# Patient Record
Sex: Male | Born: 1950 | Race: White | Hispanic: No | Marital: Married | Smoking: Current every day smoker
Health system: Southern US, Community
[De-identification: ages and names within clinical notes are randomized; demographics above are authoritative.]

## PROBLEM LIST (undated history)

## (undated) DIAGNOSIS — M2550 Pain in unspecified joint: Secondary | ICD-10-CM

## (undated) DIAGNOSIS — I731 Thromboangiitis obliterans [Buerger's disease]: Secondary | ICD-10-CM

## (undated) DIAGNOSIS — J449 Chronic obstructive pulmonary disease, unspecified: Secondary | ICD-10-CM

## (undated) DIAGNOSIS — K5090 Crohn's disease, unspecified, without complications: Secondary | ICD-10-CM

## (undated) DIAGNOSIS — F419 Anxiety disorder, unspecified: Secondary | ICD-10-CM

## (undated) DIAGNOSIS — I10 Essential (primary) hypertension: Secondary | ICD-10-CM

## (undated) DIAGNOSIS — D649 Anemia, unspecified: Secondary | ICD-10-CM

## (undated) DIAGNOSIS — M1990 Unspecified osteoarthritis, unspecified site: Secondary | ICD-10-CM

## (undated) DIAGNOSIS — I2699 Other pulmonary embolism without acute cor pulmonale: Principal | ICD-10-CM

## (undated) DIAGNOSIS — E785 Hyperlipidemia, unspecified: Secondary | ICD-10-CM

## (undated) DIAGNOSIS — I639 Cerebral infarction, unspecified: Secondary | ICD-10-CM

## (undated) HISTORY — PX: COLON SURGERY: SHX602

## (undated) HISTORY — DX: Anemia, unspecified: D64.9

## (undated) HISTORY — DX: Pain in unspecified joint: M25.50

## (undated) HISTORY — DX: Cerebral infarction, unspecified: I63.9

## (undated) HISTORY — DX: Essential (primary) hypertension: I10

## (undated) HISTORY — DX: Chronic obstructive pulmonary disease, unspecified: J44.9

## (undated) HISTORY — DX: Hyperlipidemia, unspecified: E78.5

## (undated) HISTORY — PX: OTHER SURGICAL HISTORY: SHX169

## (undated) HISTORY — DX: Unspecified osteoarthritis, unspecified site: M19.90

## (undated) HISTORY — DX: Thromboangiitis obliterans (Buerger's disease): I73.1

## (undated) HISTORY — DX: Anxiety disorder, unspecified: F41.9

## (undated) HISTORY — DX: Crohn's disease, unspecified, without complications: K50.90

## (undated) HISTORY — DX: Other pulmonary embolism without acute cor pulmonale: I26.99

## (undated) HISTORY — PX: SMALL INTESTINE SURGERY: SHX150

---

## 1977-06-19 HISTORY — PX: APPENDECTOMY: SHX54

## 2009-03-29 ENCOUNTER — Encounter: Payer: Self-pay | Admitting: Gastroenterology

## 2009-04-16 ENCOUNTER — Encounter: Payer: Self-pay | Admitting: Gastroenterology

## 2009-04-23 ENCOUNTER — Encounter (INDEPENDENT_AMBULATORY_CARE_PROVIDER_SITE_OTHER): Payer: Self-pay | Admitting: *Deleted

## 2009-05-06 ENCOUNTER — Encounter (INDEPENDENT_AMBULATORY_CARE_PROVIDER_SITE_OTHER): Payer: Self-pay | Admitting: *Deleted

## 2009-05-10 ENCOUNTER — Ambulatory Visit: Payer: Self-pay | Admitting: Gastroenterology

## 2009-05-26 ENCOUNTER — Ambulatory Visit: Payer: Self-pay | Admitting: Gastroenterology

## 2009-05-27 ENCOUNTER — Encounter: Payer: Self-pay | Admitting: Gastroenterology

## 2009-05-31 ENCOUNTER — Encounter: Payer: Self-pay | Admitting: Gastroenterology

## 2009-06-25 ENCOUNTER — Ambulatory Visit: Payer: Self-pay | Admitting: Gastroenterology

## 2009-06-25 DIAGNOSIS — K5090 Crohn's disease, unspecified, without complications: Secondary | ICD-10-CM | POA: Insufficient documentation

## 2009-06-28 ENCOUNTER — Telehealth: Payer: Self-pay | Admitting: Gastroenterology

## 2009-06-29 ENCOUNTER — Encounter: Payer: Self-pay | Admitting: Gastroenterology

## 2009-06-29 ENCOUNTER — Ambulatory Visit (HOSPITAL_COMMUNITY): Admission: RE | Admit: 2009-06-29 | Discharge: 2009-06-29 | Payer: Self-pay | Admitting: Gastroenterology

## 2009-07-02 ENCOUNTER — Telehealth: Payer: Self-pay | Admitting: Gastroenterology

## 2009-07-23 ENCOUNTER — Ambulatory Visit: Payer: Self-pay | Admitting: Gastroenterology

## 2009-08-09 ENCOUNTER — Telehealth (INDEPENDENT_AMBULATORY_CARE_PROVIDER_SITE_OTHER): Payer: Self-pay | Admitting: *Deleted

## 2009-08-10 ENCOUNTER — Encounter (INDEPENDENT_AMBULATORY_CARE_PROVIDER_SITE_OTHER): Payer: Self-pay | Admitting: *Deleted

## 2009-08-12 ENCOUNTER — Telehealth (INDEPENDENT_AMBULATORY_CARE_PROVIDER_SITE_OTHER): Payer: Self-pay | Admitting: *Deleted

## 2009-08-13 ENCOUNTER — Ambulatory Visit: Payer: Self-pay | Admitting: Gastroenterology

## 2009-08-16 ENCOUNTER — Telehealth: Payer: Self-pay | Admitting: Gastroenterology

## 2009-08-25 ENCOUNTER — Encounter (INDEPENDENT_AMBULATORY_CARE_PROVIDER_SITE_OTHER): Payer: Self-pay | Admitting: *Deleted

## 2009-08-31 LAB — CONVERTED CEMR LAB
ALT: 16 units/L (ref 0–53)
AST: 24 units/L (ref 0–37)
Albumin: 3.4 g/dL — ABNORMAL LOW (ref 3.5–5.2)
BUN: 10 mg/dL (ref 6–23)
Creatinine, Ser: 1 mg/dL (ref 0.4–1.5)
HCT: 38.1 % — ABNORMAL LOW (ref 39.0–52.0)
Hemoglobin: 13.1 g/dL (ref 13.0–17.0)
Lymphocytes Relative: 16.6 % (ref 12.0–46.0)
MCV: 104 fL — ABNORMAL HIGH (ref 78.0–100.0)
Neutrophils Relative %: 75.1 % (ref 43.0–77.0)
Platelets: 290 10*3/uL (ref 150.0–400.0)
Potassium: 4 meq/L (ref 3.5–5.1)
RDW: 13.1 % (ref 11.5–14.6)
Total Bilirubin: 1 mg/dL (ref 0.3–1.2)
Total Protein: 6.6 g/dL (ref 6.0–8.3)

## 2009-09-17 ENCOUNTER — Ambulatory Visit: Payer: Self-pay | Admitting: Gastroenterology

## 2010-07-17 LAB — CONVERTED CEMR LAB
ALT: 28 units/L (ref 0–53)
AST: 31 units/L (ref 0–37)
Albumin: 3.3 g/dL — ABNORMAL LOW (ref 3.5–5.2)
CO2: 32 meq/L (ref 19–32)
Calcium: 9.4 mg/dL (ref 8.4–10.5)
Chloride: 101 meq/L (ref 96–112)
GFR calc non Af Amer: 92.69 mL/min (ref >60)
HCT: 40.3 % (ref 39.0–52.0)
Lymphocytes Relative: 21.3 % (ref 12.0–46.0)
Lymphs Abs: 1.8 10*3/uL (ref 0.7–4.0)
MCHC: 34.5 g/dL (ref 30.0–36.0)
Platelets: 263 10*3/uL (ref 150.0–400.0)
Potassium: 3.4 meq/L — ABNORMAL LOW (ref 3.5–5.1)
RBC: 3.8 M/uL — ABNORMAL LOW (ref 4.22–5.81)
RDW: 12.7 % (ref 11.5–14.6)
Sed Rate: 9 mm/hr (ref 0–22)
Total Protein: 6.4 g/dL (ref 6.0–8.3)
WBC: 8.3 10*3/uL (ref 4.5–10.5)

## 2010-07-19 NOTE — Progress Notes (Signed)
Summary: labs due  Phone Note Outgoing Call Call back at [facility] Phone [phone]   Call placed by: Chales Abrahams CMA [name]),  August 09, 2009 1:35 PM Summary of Call: called the number listed and it is incorrect.  Called the number in the triage phone note and left a message. Initial call taken by: Chales Abrahams CMA [name]),  August 09, 2009 1:37 PM  Follow-up for Phone Call        left message on machine to call back   letter mailed Chales Abrahams CMA [name])  August 10, 2009 9:36 AM

## 2010-07-19 NOTE — Progress Notes (Signed)
Summary: Medication Refill  Phone Note Call from Patient Call back at [phone]   Caller: Patient Call For: Dr. Christella Hartigan Reason for Call: Refill Medication, Talk to Nurse Summary of Call: Pt is needing his Entocort refilled but said Ins. is needing prior approval Initial call taken by: Karna Christmas,  June 28, 2009 12:27 PM  Follow-up for Phone Call        i explained to the pt that a prior auth has been initialized but it sometimes takes 48 hours to get a response.   Follow-up by: Chales Abrahams CMA [name]),  June 28, 2009 12:47 PM

## 2010-07-19 NOTE — Progress Notes (Signed)
Summary: lab results request  Phone Note Call from Patient Call back at [phone]   Caller: Patient Call For: Dr. Christella Hartigan Reason for Call: Lab or Test Results Summary of Call: would like lab results Initial call taken by: Vallarie Mare,  August 16, 2009 11:56 AM  Follow-up for Phone Call        cbc and cmet are unremarkable Follow-up by: Louis Meckel MD,  August 16, 2009 4:56 PM     Appended Document: lab results request  Pt. given lab results.

## 2010-07-19 NOTE — Assessment & Plan Note (Signed)
History of Present Illness Visit Type: new patient Primary GI MD: Rob Bunting MD Primary Provider: Robert Bellow, MD Chief Complaint: IBD treatment History of Present Illness:     who had 1979 crohn's surgery (ileocecectomy?).  Was on meds for years (occasionally prednisone).  Tells me stayed anemic for years.  was put on shots (iron he thinks).  The last time he was on crohn's meds was about 15 years.    currently has formed, sometime sloose stools.  very rarely a bit of red blood.  No abd pains. If he eats alot at one sitting, he has abd pain, nauseas, distension, no vomitting.  Because of this he doesn't eat big meals.    Overall his weight has been stable for at least 10 years.  his biggest complaint today is that of arthritis pains in his pains. He tells me that even low dose of steroids will dramatically help his hands for as long as he takes the prednisone. This will work even on low doses of prednisone.  I did a colonoscopy for him last month. This was set up as a routine screening examination. Prior to the procedure he began to tell me about his history of Crohn's disease. I found to small, hyperplastic polyps. His ileocolonic anastomosis was strictured, biopsies taken showed fibrosis without active inflammation.           Current Medications (verified): 1)  Lisinopril-Hydrochlorothiazide 10-12.5 Mg Tabs (Lisinopril-Hydrochlorothiazide) .... Take 1 Tablet By Mouth Once A Day 2)  Metoprolol Succinate 100 Mg Xr24h-Tab (Metoprolol Succinate) .... Take 1 Tablet By Mouth Once A Day 3)  Amitriptyline Hcl 50 Mg Tabs (Amitriptyline Hcl) .... Take 1 Tab By Mouth At Bedtime 4)  Tramadol Hcl 50 Mg Tabs (Tramadol Hcl) .... Take 1 Tablet By Mouth Three Times A Day As Needed 5)  Aspirin 81 Mg Tbec (Aspirin) .... Take 1 Tablet By Mouth Once A Day  Allergies (verified): No Known Drug Allergies  Past History:  Past Medical History: Hyperlipidemia Crohns Disease 1979  surgery anemia COPD Hypertension Kidney stones Arthritis Anxiety  Past Surgical History: Crohn's surgery 1979  Family History: no colon cancer  Social History: married, one daughter, works [employer] representative, smokes one half packs per day currently, drinks 2 alcoholic beverages per day, drinks 3 caffeinated beverages per day.  Review of Systems       Pertinent positive and negative review of systems were noted in the above HPI and GI specific review of systems.  All other review of systems was otherwise negative.   Vital Signs:  Patient profile:   60 year old male Height:      69 inches Weight:      152 pounds BMI:     22.53 Pulse rate:   68 / minute Pulse rhythm:   irregular BP sitting:   90 / 60  (left arm) Cuff size:   regular  Vitals Entered By: Francee Piccolo CMA [name]) (June 25, 2009 2:49 PM)  Physical Exam  Additional Exam:  Constitutional: generally well appearing Psychiatric: alert and oriented times 3 Eyes: extraocular movements intact Mouth: oropharynx moist, no lesions Neck: supple, no lymphadenopathy Cardiovascular: heart regular rate and rythm Lungs: CTA bilaterally Abdomen: soft, non-tender, non-distended, no obvious ascites, no peritoneal signs, normal bowel sounds Extremities: no lower extremity edema bilaterally Skin: no lesions on visible extremities     Impression & Recommendations:  Problem # 1:  Crohn's disease he add least has a strictured ileocolonic anastomosis, fibrosis. It is not  clear whether he also has active Crohn's disease. I think he is having symptoms from his stricturing, postprandial mild obstructive symptoms which she has been dealing with for many years. His hands, joint pains may be IBD related especially given the fact that prednisone will help them.  He will get a CBC, complete metabolic profile, sedimentation rate and phenotyping for TPMT enzyme. She will also get a small bowel follow-through. He will return to see me  in 4 weeks' time. I will also start him on budesonide 3 pills a day.  Other Orders: TLB-CBC Platelet - w/Differential (85025-CBCD) TLB-CMP (Comprehensive Metabolic Pnl) (80053-COMP) TLB-Sedimentation Rate (ESR) (85652-ESR) T-[facility] TPMT Enzyme #[id number] ([address])  Patient Instructions: 1)  You will get lab test(s) done today (cbc, cmet, esr, TPMT phenotype testing). 2)  Smoking will make Crohn's worse, try stopping. 3)  Will set up UGI with SBFT, check for small bowel crohn's disease. 4)  Return to see Dr. Christella Hartigan in 4 weeks. 5)  A copy of this information will be sent to Dr. Perrin Maltese. 6)  Start budesonide 3 pills a day, called into your pharmacy. 7)  The medication list was reviewed and reconciled.  All changed / newly prescribed medications were explained.  A complete medication list was provided to the patient / caregiver. Prescriptions: ENTOCORT EC 3 MG  CP24 (BUDESONIDE) Take 3 capsules daily  #90 x 3   Entered and Authorized by:   Rachael Fee MD   Signed by:   Rachael Fee MD on 06/25/2009   Method used:   [facility]  [address] [id number]       [address]       [place], [place]  [address]       Ph: [phone] or [phone]       Fax: [phone]   RxID:   [id number]   Appended Document: Orders Update/UGI    Clinical Lists Changes  Orders: Added new Test order of UGI with SBFT (UGI w/SBFT) - Signed      Appended Document:  [facility] testing: normal TPMT enzyme activity dated 06/2009

## 2010-07-19 NOTE — Progress Notes (Signed)
Summary: Triage  Phone Note Call from Patient Call back at [phone]   Caller: Patient Call For: Dr. Christella Hartigan Reason for Call: Talk to Nurse Summary of Call: Pt. was prescribed Entocort and it is too expensive. Wanted to know if we have any type of discount cards for this medication or if something else can be prescribed Initial call taken by: Karna Christmas,  July 02, 2009 12:58 PM  Follow-up for Phone Call        can you check on any discount programs from drug company Follow-up by: Rachael Fee MD,  July 02, 2009 1:32 PM  Additional Follow-up for Phone Call Additional follow up Details #1::        Drug company is mailing the pt a voucher to use for his medication.  pt aware Additional Follow-up by: Chales Abrahams CMA [name]),  July 02, 2009 2:02 PM

## 2010-07-19 NOTE — Letter (Signed)
Summary: Appointment Reminder  [facility]  [address] [place], [place] [address]   Phone: [phone]  Fax: [phone]        August 10, 2009 MRN: [id number]    Tyler [name] [address] [place], [place]  [address]    Dear [name],   We have been unable to reach you by phone to remind you of a lab  appointment that was recommended for you by Dr. Christella Hartigan.  It is very   important that you come to our basement lab at your soonest convenience.   We hope you allow [place] to participate in your health care needs. The   basement lab is open from 7:30am to 5:00pm Monday thru Friday at our   [place] office.     Sincerely,    Chales Abrahams CMA (AAMA)

## 2010-07-19 NOTE — Miscellaneous (Signed)
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

## 2010-07-19 NOTE — Progress Notes (Signed)
Summary: Triage  Phone Note Call from Patient Call back at Home Phone [phone]   Summary of Call: Pt feels the azathioprine is making him hurt all over, should he continue to take?  He is coming in Monday to get labs.  [phone]  cell Initial call taken by: Chales Abrahams CMA [name]),  August 12, 2009 2:57 PM  Follow-up for Phone Call        have him cut down to 2 pills a day (100mg ) and see if symptoms improve. Follow-up by: Rachael Fee MD,  August 12, 2009 3:32 PM  Additional Follow-up for Phone Call Additional follow up Details #1::        pt aware, he will call back if symptoms do not improve Additional Follow-up by: Chales Abrahams CMA [name]),  August 12, 2009 3:39 PM

## 2010-07-19 NOTE — Letter (Signed)
Summary: Results Letter  [facility]  [address] [place], [place] [address]   Phone: [phone]  Fax: [phone]        August 25, 2009 MRN: [id number]    Tyler [name] [address] [place], [place]  [address]    Dear [name],  I have been unable to reach you by phone regarding your lab results.  Please call at your earliest convenience so that we may discuss your   results.  Thank you for your time.          Sincerely,  Chales Abrahams CMA (AAMA)  This letter has been electronically signed by your physician.  Appended Document: Results Letter letter mailed

## 2010-07-19 NOTE — Assessment & Plan Note (Signed)
Review of gastrointestinal problems: 1. Crohn's ileitis; diagnosed 1979, remote ileocecectomy. Colonoscopy December 2010 (sent in for routine screening) found strictured ileocecal anastomosis, biopsies showed fibrosis without active inflammation.  He has intermittent postprandial obstructive type pains that are not very bothersome to him, December 2000 and upper GI with small bowel follow-through showed stricture at neo TI. Labs December 2010: sedimentation rate normal, TPMT phenotyping was normal.    History of Present Illness Visit Type: Follow-up Visit Primary GI MD: Rob Bunting MD Primary Provider: Robert Bellow, MD Chief Complaint: Follow up History of Present Illness:     very pleasant 60 year old man whom I last saw about and then he has started on Entocort 3 pills a day. He may have noticed some slight improvement in his arthritis symptoms, not much difference in his abdominal pains.           Current Medications (verified): 1)  Lisinopril-Hydrochlorothiazide 10-12.5 Mg Tabs (Lisinopril-Hydrochlorothiazide) .... Take 1 Tablet By Mouth Once A Day 2)  Metoprolol Succinate 100 Mg Xr24h-Tab (Metoprolol Succinate) .... Take 1 Tablet By Mouth Once A Day 3)  Amitriptyline Hcl 50 Mg Tabs (Amitriptyline Hcl) .... Take 1 Tab By Mouth At Bedtime 4)  Tramadol Hcl 50 Mg Tabs (Tramadol Hcl) .... Take 1 Tablet By Mouth Three Times A Day As Needed 5)  Aspirin 81 Mg Tbec (Aspirin) .... Take 1 Tablet By Mouth Once A Day 6)  Entocort Ec 3 Mg  Cp24 (Budesonide) .... Take 3 Capsules Daily  Allergies (verified): No Known Drug Allergies  Vital Signs:  Patient profile:   60 year old male Height:      69 inches Weight:      153.0 pounds BMI:     22.68 Pulse rate:   68 / minute Pulse rhythm:   regular BP sitting:   110 / 64  (right arm) Cuff size:   regular  Vitals Entered By: Harlow Mares CMA [name]) (July 23, 2009 3:48 PM)  Physical Exam  Additional Exam:  Constitutional:  generally well appearing Psychiatric: alert and oriented times 3 Abdomen: soft, non-tender, non-distended, normal bowel sounds    Impression & Recommendations:  Problem # 1:  Crohn's disease he poses a therapeutic dilemma. It is not appear that he has active Crohn's disease in his GI tract, certainly no Crohn's colitis on colonoscopy and the stricture at his neoterminal ileum appeared fibrotic, old endoscopically and on biopsies. He has intermittent, mild postprandial discomforts that may be obstructive in nature. He has dealt with this for many many years and is frankly not very bothered by it. He is most bothered however by joint pains in his hands especially that are usually relieved with steroids. I wonder if these are IBD related joint pains.   He has not noticed too much improvement on Entocort (that is not very good improvement with his joint pains). I will like to stop his Entocort and start him on immunomodulators. I think clinicallythe goal will be to see if his possibly IBD related joint pains are improved with a dose of azathioprine 150 mg daily over the next few months. I have also called in some tramadol as well. He will return to see me in 6 weeks and we will get repeat set of labs including a CBC, complete metabolic profile 2 weeks for now.  Patient Instructions: 1)  Decrease the entocort by 1 pill every week. Should be off in 2-3 weeks. 2)  Start azathiaprine 150mg  a day (3 pills a day, called  in to pharmacy). 3)  You will get lab test(s) done in 2 weeks (cbc, cmet). 4)  Tramadal called in to pharmacy. 5)  Return to see Dr. Christella Hartigan in 2 months. 6)  The medication list was reviewed and reconciled.  All changed / newly prescribed medications were explained.  A complete medication list was provided to the patient / caregiver. Prescriptions: AZATHIOPRINE 50 MG  TABS (AZATHIOPRINE) take 3 pills every morning  #90 x 3   Entered and Authorized by:   Rachael Fee MD   Signed by:    Rachael Fee MD on 07/23/2009   Method used:   [facility]  [address] [id number]       [address]       [place], [place]  [address]       Ph: [phone] or [phone]       Fax: [phone]   RxID:   [id number] TRAMADOL HCL 50 MG TABS (TRAMADOL HCL) Take 1 tablet by mouth three times a day as needed  #90 x 3   Entered and Authorized by:   Rachael Fee MD   Signed by:   Rachael Fee MD on 07/23/2009   Method used:   [facility]        [facility]  [address] [id number]       [address]       [place], [place]  [address]       Ph: [phone] or [phone]       Fax: [phone]   RxID:   [id number]

## 2010-09-03 ENCOUNTER — Encounter: Payer: Self-pay | Admitting: Cardiology

## 2010-09-15 ENCOUNTER — Ambulatory Visit: Payer: Self-pay | Admitting: Cardiology

## 2010-12-12 ENCOUNTER — Encounter (INDEPENDENT_AMBULATORY_CARE_PROVIDER_SITE_OTHER): Payer: 59 | Admitting: Surgery

## 2010-12-12 ENCOUNTER — Encounter (INDEPENDENT_AMBULATORY_CARE_PROVIDER_SITE_OTHER): Payer: 59

## 2010-12-12 DIAGNOSIS — I7300 Raynaud's syndrome without gangrene: Secondary | ICD-10-CM

## 2010-12-12 DIAGNOSIS — M79609 Pain in unspecified limb: Secondary | ICD-10-CM

## 2010-12-12 DIAGNOSIS — I731 Thromboangiitis obliterans [Buerger's disease]: Secondary | ICD-10-CM

## 2010-12-13 NOTE — Assessment & Plan Note (Signed)
OFFICE VISIT  Tyler [name] DOB:  28-Mar-1951                                       12/12/2010 JXBJY#:[id number]  REASON FOR VISIT:  Evaluate both hands, rule out vascular disease.  REFERRING PHYSICIANS:  Dr. Merla Riches, Dr. Perrin Maltese.  HISTORY:  This is [name] 60 year old gentleman that I am seeing for evaluation of bilateral hand pain.  The patient states that he has been having problems for greater than [name] year.  He complains of pain in his joints and his hands.  They appear to lock up and become very painful. His fingers will turn white and his hand cramps.  These are exacerbated by nothing.  They are relieved with the use of prednisone and hydrocodone.  He has not had any ulceration or nonhealing wounds on his hands.  The patient does smoke 2 packs per day and has done so for over 30 years.  He has undergone bilateral carpal tunnel injections without improvement.  He has had an EMG nerve conduction study.  He has [name] history of Crohn's which has not been active for several decades following resection.  He does not complain of any redness or bluish change in color.  He has not had any swelling.  REVIEW OF SYSTEMS:  GENERAL:  Negative for fevers, chills, weight gain, weight loss. CARDIAC:  Positive for shortness of breath with exertion. GI:  Positive for blood in the stool. MUSCULOSKELETAL:  Positive for joint pain. All other review of systems are negative as documented in the encounter form except what is stated in the above HPI.  PAST MEDICAL HISTORY:  Crohn's disease, hypertension, COPD.  PAST SURGICAL HISTORY:  Bowel resection 1979.  SOCIAL HISTORY:  Works in [employer] representative.  Smokes 2 packs cigarettes per day for 30 years.  Occasional alcohol.  FAMILY HISTORY:  Negative for autoimmune disease.  No premature cardiovascular disease.  ALLERGIES:  None.  PHYSICAL EXAM:  Vital signs:  Heart rate is 81, blood pressure 122/81, O2 sats 98%.  General:  He  is well-appearing, in no distress.  HEENT: Within normal limits.  Respirations:  Nonlabored.  Cardiovascular:  He has palpable radial and brachial pulses bilaterally.  Abdomen:  Soft, nontender.  Musculoskeletal:  Without major deformity.  Skin:  No rash. Neurological:  No focal deficits.  DIAGNOSTIC STUDIES:  Ultrasound of his upper extremities was performed today.  This reveals triphasic waveforms and bilateral axillary, subclavian, brachial and radial arteries.  The right ulnar is biphasic and the left ulnar is triphasic.  He has dampened first digit waveforms on the right but all the other digital waveforms are normal.  ASSESSMENT:  Bilateral hand pain.  PLAN:  With the patient's clinical exam findings as well as his ultrasound I do not feel that this is related to arterial insufficiency. I do not at this point see the need for additional invasive imaging such as angiogram because he does have such brisk pulses in his wrists.  I think this most likely would make Buerger's disease less likely but still possible given his smoking history.  His history is also not consistent with Raynaud's phenomenon as he does not endorse [name] bluish or reddish skin color changes but only is turning white.  He most likely has an autoimmune component given his history of Crohn's disease. Regardless, at this point I do not think that this is related  to arterial insufficiency.  The patient will follow up on [name] p.r.n. basis.    Jorge Ny, MD Electronically Signed  VWB/MEDQ  D:  12/12/2010  T:  12/13/2010  Job:  [id number]  cc:   Vilma Prader Dr Ula Lingo P. Merla Riches, M.D. Dr Vilma Prader

## 2011-05-27 ENCOUNTER — Ambulatory Visit (INDEPENDENT_AMBULATORY_CARE_PROVIDER_SITE_OTHER): Payer: 59

## 2011-05-27 DIAGNOSIS — I7300 Raynaud's syndrome without gangrene: Secondary | ICD-10-CM

## 2011-05-27 DIAGNOSIS — M25549 Pain in joints of unspecified hand: Secondary | ICD-10-CM

## 2011-05-30 ENCOUNTER — Encounter: Payer: Self-pay | Admitting: Surgery

## 2011-06-02 ENCOUNTER — Encounter: Payer: Self-pay | Admitting: Surgery

## 2011-06-05 ENCOUNTER — Encounter: Payer: Self-pay | Admitting: Surgery

## 2011-06-05 ENCOUNTER — Ambulatory Visit (INDEPENDENT_AMBULATORY_CARE_PROVIDER_SITE_OTHER): Payer: Managed Care, Other (non HMO) | Admitting: Surgery

## 2011-06-05 VITALS — BP 134/91 | HR 76 | Temp 98.1°F | Ht 69.0 in | Wt 162.0 lb

## 2011-06-05 DIAGNOSIS — M79643 Pain in unspecified hand: Secondary | ICD-10-CM | POA: Insufficient documentation

## 2011-06-05 DIAGNOSIS — M79609 Pain in unspecified limb: Secondary | ICD-10-CM

## 2011-06-05 NOTE — Progress Notes (Signed)
[facility] [place]   Patient name: Tyler [name] MRN: [id number] DOB: [birth year] Sex: male     Chief Complaint  Patient presents with  . New Evaluation    Right Hand pain ( Has seen Rheumatology in [facility])   Right fingers stay blanched and tingle. Tips turn purple especially in the cold.  No injury.    HISTORY OF PRESENT ILLNESS: The patient returns today having been sent from the emergency department. I last saw him in June of this year for evaluation of bilateral hand pain. He's been having this issue for greater than one year. He states that his hands and fingers will become white-tan painful but that'll usually resolves within several days. About a week he'll he had another episode and falling his right hand and has not improved. He states that his right hand his cold is somewhat bluish on appearance on his index finger there is a skin tear that has not healed in over a week but he states is from a splinter. He was initially evaluated with ultrasound and found to have triphasic radial artery signals bilaterally the right ulnar was biphasic he did have dampened waveforms in the first digit on the right but all of the waveforms were normal I felt this was likely some form of an autoimmune component given his Crohn's disease. He could also have been due to early Buerger's disease given his extensive smoking history. He does continue to smoke and he is taking an aspirin. He states that the pain is much worse now and is not getting any better. He is taking tramadol but this is minimal relief.  Past Medical History  Diagnosis Date  . Hyperlipidemia   . Crohn disease   . Anemia   . COPD (chronic obstructive pulmonary disease)   . Hypertension   . Kidney stones   . Arthritis   . Anxiety   . Joint pain     Past Surgical History  Procedure Date  . Crohn's surgery 1979    History   Social History  . Marital Status: Married    Spouse Name: N/A    Number of  Children: N/A  . Years of Education: N/A   Occupational History  . Construction    Social History Main Topics  . Smoking status: Current Everyday Smoker -- 1.5 packs/day for 30 years    Types: Cigarettes  . Smokeless tobacco: Never Used  . Alcohol Use: Yes     drinks 2 alcohol beverages per day  . Drug Use: No  . Sexually Active:    Other Topics Concern  . Not on file   Social History Narrative   Married, has one daughter, works Holiday representative, smokes 1/2 ppd currently, drunks 2 alcoholic beverages per day, and drinks 3 caffeine beverages per day.    Family History  Problem Relation Age of Onset  . Colon cancer      no family Hx of    Allergies as of 06/05/2011  . (No Known Allergies)    Current Outpatient Prescriptions on File Prior to Visit  Medication Sig Dispense Refill  . albuterol (PROVENTIL HFA;VENTOLIN HFA) 108 (90 BASE) MCG/ACT inhaler Inhale 2 puffs into the lungs every 6 (six) hours as needed.        Marland Kitchen amitriptyline (ELAVIL) 50 MG tablet Take 50 mg by mouth at bedtime.        Marland Kitchen aspirin 81 MG tablet Take 81 mg by mouth daily.        Marland Kitchen  losartan (COZAAR) 50 MG tablet Take 50 mg by mouth daily.        . metoprolol (TOPROL-XL) 100 MG 24 hr tablet Take 100 mg by mouth daily.        . predniSONE (DELTASONE) 10 MG tablet Take 10 mg by mouth every other day.        . tiotropium (SPIRIVA) 18 MCG inhalation capsule Place 18 mcg into inhaler and inhale daily.        . traMADol (ULTRAM) 50 MG tablet Take 50 mg by mouth. Take 1 tablet by mouth 3 times daily as needed       . azaTHIOprine (IMURAN) 50 MG tablet Take 50 mg by mouth. Take 3 pills every morning       . budesonide (ENTOCORT EC) 3 MG 24 hr capsule Take 3 mg by mouth. Take 3 capsules daily       . lisinopril-hydrochlorothiazide (PRINZIDE,ZESTORETIC) 10-12.5 MG per tablet Take 1 tablet by mouth daily.           REVIEW OF SYSTEMS: Cardiovascular: No chest pain, chest pressure, palpitations, orthopnea, or dyspnea on  exertion. No claudication or rest pain,  No history of DVT or phlebitis. Pulmonary: No productive cough, asthma or wheezing. Neurologic: No weakness, paresthesias, aphasia, or amaurosis. No dizziness. Hematologic: No bleeding problems or clotting disorders. Musculoskeletal: No joint pain or joint swelling. Gastrointestinal: No blood in stool or hematemesis Genitourinary: No dysuria or hematuria. Psychiatric:: No history of major depression. Integumentary: No rashes Constitutional: No fever or chills.  PHYSICAL EXAMINATION:   Vital signs are BP 134/91  Pulse 76  Temp(Src) 98.1 F (36.7 C) (Oral)  Ht 5\' 9"  (1.753 m)  Wt 162 lb (73.483 kg)  BMI 23.92 kg/m2  SpO2 100% General: The patient appears their stated age. HEENT:  No gross abnormalities Pulmonary:  Non labored breathing Musculoskeletal: There are no major deformities. Neurologic: No focal weakness or paresthesias are detected, Skin: There's a bluish discoloration on the right index middle and ring finger. There is a slight skin tear on the right index finger without evidence of infection Psychiatric: The patient has normal affect. Cardiovascular: There is a regular rate and rhythm without significant murmur appreciated. Palpable radial pulse bilaterally   Diagnostic Studies None  Assessment: Ischemic changes to the right first second and third finger Plan: I Am scheduling the patient for formal angiogram to get good views of his hand. In addition I want to rule out any form of aneurysmal disease  further proximal. Assuming that this is negative I am going to refer him to a hand surgeon. We discussed the need for smoking cessation. He'll continue his antiplatelet therapy. His angiogram has been scheduled for Wednesday, December 26 this will be performed by my partner Dr. Gretta Began. The patient cannot have this done sooner because he is going out of town for work-related issues this entire week. I did give and 30 oxycodone  tablets a day to help with his pain control  V. Charlena Cross, M.D. [facility] [place] Office: [phone] Pager:  [phone]

## 2011-06-06 ENCOUNTER — Other Ambulatory Visit: Payer: Self-pay

## 2011-06-08 ENCOUNTER — Encounter (HOSPITAL_COMMUNITY): Payer: Self-pay | Admitting: Pharmacy Technician

## 2011-06-13 MED ORDER — SODIUM CHLORIDE 0.9 % IV SOLN
INTRAVENOUS | Status: DC
  Administered 2011-06-14: 09:00:00 via INTRAVENOUS

## 2011-06-14 ENCOUNTER — Encounter (HOSPITAL_COMMUNITY): Payer: Self-pay | Admitting: *Deleted

## 2011-06-14 ENCOUNTER — Other Ambulatory Visit: Payer: Self-pay

## 2011-06-14 ENCOUNTER — Ambulatory Visit (HOSPITAL_COMMUNITY): Payer: Managed Care, Other (non HMO)

## 2011-06-14 ENCOUNTER — Inpatient Hospital Stay (HOSPITAL_COMMUNITY)
Admission: RE | Admit: 2011-06-14 | Discharge: 2011-06-15 | DRG: 091 | Disposition: A | Discharge status: Home / Self Care | Payer: Managed Care, Other (non HMO) | Source: Ambulatory Visit | Attending: Vascular Surgery | Admitting: Vascular Surgery

## 2011-06-14 ENCOUNTER — Inpatient Hospital Stay (HOSPITAL_COMMUNITY): Payer: Managed Care, Other (non HMO)

## 2011-06-14 ENCOUNTER — Encounter (HOSPITAL_COMMUNITY)
Admission: RE | Disposition: A | Discharge status: Home / Self Care | Payer: Self-pay | Source: Ambulatory Visit | Attending: Vascular Surgery

## 2011-06-14 DIAGNOSIS — G459 Transient cerebral ischemic attack, unspecified: Secondary | ICD-10-CM

## 2011-06-14 DIAGNOSIS — I6340 Cerebral infarction due to embolism of unspecified cerebral artery: Secondary | ICD-10-CM | POA: Diagnosis not present

## 2011-06-14 DIAGNOSIS — Y849 Medical procedure, unspecified as the cause of abnormal reaction of the patient, or of later complication, without mention of misadventure at the time of the procedure: Secondary | ICD-10-CM | POA: Diagnosis not present

## 2011-06-14 DIAGNOSIS — Z7982 Long term (current) use of aspirin: Secondary | ICD-10-CM

## 2011-06-14 DIAGNOSIS — M129 Arthropathy, unspecified: Secondary | ICD-10-CM | POA: Diagnosis present

## 2011-06-14 DIAGNOSIS — I70209 Unspecified atherosclerosis of native arteries of extremities, unspecified extremity: Secondary | ICD-10-CM | POA: Diagnosis present

## 2011-06-14 DIAGNOSIS — R4701 Aphasia: Secondary | ICD-10-CM | POA: Diagnosis present

## 2011-06-14 DIAGNOSIS — R29810 Facial weakness: Secondary | ICD-10-CM | POA: Diagnosis present

## 2011-06-14 DIAGNOSIS — I10 Essential (primary) hypertension: Secondary | ICD-10-CM | POA: Diagnosis present

## 2011-06-14 DIAGNOSIS — F411 Generalized anxiety disorder: Secondary | ICD-10-CM | POA: Diagnosis present

## 2011-06-14 DIAGNOSIS — IMO0002 Reserved for concepts with insufficient information to code with codable children: Principal | ICD-10-CM | POA: Diagnosis not present

## 2011-06-14 DIAGNOSIS — F17200 Nicotine dependence, unspecified, uncomplicated: Secondary | ICD-10-CM | POA: Diagnosis present

## 2011-06-14 DIAGNOSIS — I639 Cerebral infarction, unspecified: Secondary | ICD-10-CM

## 2011-06-14 DIAGNOSIS — E785 Hyperlipidemia, unspecified: Secondary | ICD-10-CM | POA: Diagnosis present

## 2011-06-14 DIAGNOSIS — J4489 Other specified chronic obstructive pulmonary disease: Secondary | ICD-10-CM | POA: Diagnosis present

## 2011-06-14 DIAGNOSIS — J449 Chronic obstructive pulmonary disease, unspecified: Secondary | ICD-10-CM | POA: Diagnosis present

## 2011-06-14 DIAGNOSIS — M79609 Pain in unspecified limb: Secondary | ICD-10-CM

## 2011-06-14 DIAGNOSIS — K5090 Crohn's disease, unspecified, without complications: Secondary | ICD-10-CM | POA: Diagnosis present

## 2011-06-14 HISTORY — PX: ARCH AORTOGRAM: SHX5501

## 2011-06-14 HISTORY — DX: Cerebral infarction, unspecified: I63.9

## 2011-06-14 HISTORY — PX: UNILATERAL UPPER EXTREMEITY ANGIOGRAM: SHX5517

## 2011-06-14 LAB — CBC
HCT: 37.4 % — ABNORMAL LOW (ref 39.0–52.0)
Hemoglobin: 12.3 g/dL — ABNORMAL LOW (ref 13.0–17.0)
Hemoglobin: 12.6 g/dL — ABNORMAL LOW (ref 13.0–17.0)
MCHC: 32.5 g/dL (ref 30.0–36.0)
Platelets: 278 10*3/uL (ref 150–400)
RBC: 4.35 MIL/uL (ref 4.22–5.81)
RDW: 14.8 % (ref 11.5–15.5)

## 2011-06-14 LAB — BASIC METABOLIC PANEL
CO2: 24 mEq/L (ref 19–32)
Glucose, Bld: 85 mg/dL (ref 70–99)
Potassium: 3.9 mEq/L (ref 3.5–5.1)
Sodium: 138 mEq/L (ref 135–145)

## 2011-06-14 LAB — POCT I-STAT, CHEM 8
Calcium, Ion: 1.18 mmol/L (ref 1.12–1.32)
Glucose, Bld: 102 mg/dL — ABNORMAL HIGH (ref 70–99)
HCT: 44 % (ref 39.0–52.0)
Hemoglobin: 15 g/dL (ref 13.0–17.0)

## 2011-06-14 LAB — CARDIAC PANEL(CRET KIN+CKTOT+MB+TROPI)
CK, MB: 3.1 ng/mL (ref 0.3–4.0)
Total CK: 66 U/L (ref 7–232)
Troponin I: <0.3 ng/mL (ref <0.30)

## 2011-06-14 LAB — PROTIME-INR
INR: 1 (ref 0.00–1.49)
Prothrombin Time: 13.4 seconds (ref 11.6–15.2)

## 2011-06-14 LAB — GLUCOSE, CAPILLARY: Glucose-Capillary: 92 mg/dL (ref 70–99)

## 2011-06-14 SURGERY — ARCH AORTOGRAM
Anesthesia: LOCAL | Laterality: Right

## 2011-06-14 MED ORDER — HYDRALAZINE HCL 20 MG/ML IJ SOLN
10.0000 mg | INTRAMUSCULAR | Status: DC | PRN
  Filled 2011-06-14: qty 0.5

## 2011-06-14 MED ORDER — MORPHINE SULFATE 2 MG/ML IJ SOLN
2.0000 mg | INTRAMUSCULAR | Status: DC | PRN
  Administered 2011-06-14 – 2011-06-15 (×3): 2 mg via INTRAVENOUS
  Filled 2011-06-14 (×3): qty 1

## 2011-06-14 MED ORDER — FENTANYL CITRATE 0.05 MG/ML IJ SOLN
INTRAMUSCULAR | Status: AC
  Filled 2011-06-14: qty 2

## 2011-06-14 MED ORDER — DEXTROSE-NACL 5-0.45 % IV SOLN
INTRAVENOUS | Status: DC
  Administered 2011-06-14 – 2011-06-15 (×2): via INTRAVENOUS

## 2011-06-14 MED ORDER — PHENOL 1.4 % MT LIQD: 1.0000 | OROMUCOSAL | Status: DC | PRN

## 2011-06-14 MED ORDER — ASPIRIN 300 MG RE SUPP
300.0000 mg | Freq: Every day | RECTAL | Status: DC
  Filled 2011-06-14 (×2): qty 1

## 2011-06-14 MED ORDER — GUAIFENESIN-DM 100-10 MG/5ML PO SYRP: 15.0000 mL | ORAL_SOLUTION | ORAL | Status: DC | PRN

## 2011-06-14 MED ORDER — METOPROLOL TARTRATE 1 MG/ML IV SOLN
2.0000 mg | INTRAVENOUS | Status: DC | PRN
  Filled 2011-06-14: qty 5

## 2011-06-14 MED ORDER — LORAZEPAM 2 MG/ML IJ SOLN
2.0000 mg | INTRAMUSCULAR | Status: DC | PRN
  Filled 2011-06-14: qty 1

## 2011-06-14 MED ORDER — ASPIRIN 81 MG PO TABS: 81.0000 mg | ORAL_TABLET | Freq: Every day | ORAL | Status: DC

## 2011-06-14 MED ORDER — AMITRIPTYLINE HCL 50 MG PO TABS
50.0000 mg | ORAL_TABLET | Freq: Every day | ORAL | Status: DC
  Administered 2011-06-14: 50 mg via ORAL
  Filled 2011-06-14 (×3): qty 1

## 2011-06-14 MED ORDER — METOPROLOL SUCCINATE ER 100 MG PO TB24
100.0000 mg | ORAL_TABLET | Freq: Every day | ORAL | Status: DC
  Administered 2011-06-14: 100 mg via ORAL
  Filled 2011-06-14 (×2): qty 1

## 2011-06-14 MED ORDER — ASPIRIN 325 MG PO TABS
325.0000 mg | ORAL_TABLET | Freq: Every day | ORAL | Status: DC
  Administered 2011-06-14 – 2011-06-15 (×2): 325 mg via ORAL
  Filled 2011-06-14 (×2): qty 1

## 2011-06-14 MED ORDER — PREDNISONE 10 MG PO TABS
10.0000 mg | ORAL_TABLET | ORAL | Status: DC
  Administered 2011-06-14: 10 mg via ORAL
  Filled 2011-06-14: qty 1

## 2011-06-14 MED ORDER — ENOXAPARIN SODIUM 40 MG/0.4ML ~~LOC~~ SOLN
40.0000 mg | Freq: Every day | SUBCUTANEOUS | Status: DC
  Administered 2011-06-14 – 2011-06-15 (×2): 40 mg via SUBCUTANEOUS
  Filled 2011-06-14 (×2): qty 0.4

## 2011-06-14 MED ORDER — LABETALOL HCL 5 MG/ML IV SOLN: 10.0000 mg | INTRAVENOUS | Status: DC | PRN

## 2011-06-14 MED ORDER — TRAMADOL HCL 50 MG PO TABS
50.0000 mg | ORAL_TABLET | Freq: Four times a day (QID) | ORAL | Status: DC | PRN
  Administered 2011-06-14: 50 mg via ORAL
  Filled 2011-06-14: qty 1

## 2011-06-14 MED ORDER — POTASSIUM CHLORIDE CRYS ER 20 MEQ PO TBCR
20.0000 meq | EXTENDED_RELEASE_TABLET | Freq: Once | ORAL | Status: AC
  Administered 2011-06-14: 40 meq via ORAL
  Filled 2011-06-14: qty 2

## 2011-06-14 MED ORDER — TIOTROPIUM BROMIDE MONOHYDRATE 18 MCG IN CAPS
18.0000 ug | ORAL_CAPSULE | Freq: Every day | RESPIRATORY_TRACT | Status: DC
  Filled 2011-06-14: qty 5

## 2011-06-14 MED ORDER — AMLODIPINE BESYLATE 10 MG PO TABS
10.0000 mg | ORAL_TABLET | Freq: Every day | ORAL | Status: DC
  Administered 2011-06-14 – 2011-06-15 (×2): 10 mg via ORAL
  Filled 2011-06-14 (×2): qty 1

## 2011-06-14 MED ORDER — MIDAZOLAM HCL 2 MG/2ML IJ SOLN
INTRAMUSCULAR | Status: AC
  Filled 2011-06-14: qty 2

## 2011-06-14 MED ORDER — ALUM & MAG HYDROXIDE-SIMETH 200-200-20 MG/5ML PO SUSP: 15.0000 mL | ORAL | Status: DC | PRN

## 2011-06-14 MED ORDER — SENNOSIDES-DOCUSATE SODIUM 8.6-50 MG PO TABS: 1.0000 | ORAL_TABLET | Freq: Every evening | ORAL | Status: DC | PRN

## 2011-06-14 MED ORDER — HEPARIN (PORCINE) IN NACL 2-0.9 UNIT/ML-% IJ SOLN
INTRAMUSCULAR | Status: AC
  Filled 2011-06-14: qty 1000

## 2011-06-14 MED ORDER — ONDANSETRON HCL 4 MG/2ML IJ SOLN: 4.0000 mg | Freq: Four times a day (QID) | INTRAMUSCULAR | Status: DC | PRN

## 2011-06-14 MED ORDER — LIDOCAINE HCL (PF) 1 % IJ SOLN
INTRAMUSCULAR | Status: AC
  Filled 2011-06-14: qty 30

## 2011-06-14 MED ORDER — ENOXAPARIN SODIUM 40 MG/0.4ML ~~LOC~~ SOLN: 40.0000 mg | SUBCUTANEOUS | Status: DC

## 2011-06-14 MED ORDER — FAMOTIDINE IN NACL 20-0.9 MG/50ML-% IV SOLN
20.0000 mg | Freq: Two times a day (BID) | INTRAVENOUS | Status: DC
  Administered 2011-06-14 – 2011-06-15 (×3): 20 mg via INTRAVENOUS
  Filled 2011-06-14 (×4): qty 50

## 2011-06-14 MED ORDER — ALBUTEROL SULFATE HFA 108 (90 BASE) MCG/ACT IN AERS
2.0000 | INHALATION_SPRAY | Freq: Four times a day (QID) | RESPIRATORY_TRACT | Status: DC | PRN
  Filled 2011-06-14: qty 6.7

## 2011-06-14 NOTE — Progress Notes (Signed)
The patient remained stable in the posterior calf holding area. I discussed the situation at length with the patient's wife and the patient himself. Also discussed this with the neurology consult. His wife feels that his speech is is his normal baseline and he does have a slight left facial droop. He understands he will be admitted for observation and other workup per neurology service.

## 2011-06-14 NOTE — Consult Note (Signed)
[facility] Consult Note  Date: 06/14/2011  Patient name: Tyler [name] Medical record number: [id number] Date of birth: 11-20-1950 Age: 60 y.o. Gender: male   Chief Complaint:Slurred Speech  History of Present Illness: Tyler [name] is an 60 y.o. male  Undergoing PV angiogram for R hand ischemia.  During case, pt developed mild dysarthria and L facial droop.  No extremity weakness.  Last seen normal 11:15.   Code Stroke called.  NIH 2. Modified Rankin Score 0.  Head CT neg of hemorrhage.  Formal report pending.  Dr. Lyman Speller examined the patient and cancelled Code Stroke for NIH 2 and sx resolving.   Meds Inpatient  Scheduled:   . fentaNYL      . heparin      . lidocaine      . midazolam        Prior to Admission medications   Medication Sig Start Date End Date Taking? Authorizing Provider  albuterol (PROVENTIL HFA;VENTOLIN HFA) 108 (90 BASE) MCG/ACT inhaler Inhale 2 puffs into the lungs every 6 (six) hours as needed. For shortness of breath   Yes Historical Provider, MD  amitriptyline (ELAVIL) 50 MG tablet Take 50 mg by mouth at bedtime.     Yes Historical Provider, MD  amLODipine (NORVASC) 10 MG tablet Take 10 mg by mouth daily.     Yes Historical Provider, MD  aspirin 81 MG tablet Take 81 mg by mouth daily.     Yes Historical Provider, MD  metoprolol (TOPROL-XL) 100 MG 24 hr tablet Take 100 mg by mouth at bedtime.    Yes Historical Provider, MD  predniSONE (DELTASONE) 10 MG tablet Take 10 mg by mouth every other day.     Yes Historical Provider, MD  tiotropium (SPIRIVA) 18 MCG inhalation capsule Place 18 mcg into inhaler and inhale daily.     Yes Historical Provider, MD  traMADol (ULTRAM) 50 MG tablet Take 50 mg by mouth every 6 (six) hours as needed. For pain.   Yes Historical Provider, MD     Allergies: Review of patient's allergies indicates no known allergies.  Past Medical History  Diagnosis Date  . Hyperlipidemia   . Crohn disease   . Anemia   . COPD  (chronic obstructive pulmonary disease)   . Hypertension   . Kidney stones   . Arthritis   . Anxiety   . Joint pain    Past Surgical History  Procedure Date  . Crohn's surgery 1979   Family History  Problem Relation Age of Onset  . Colon cancer      no family Hx of   Social History: Married.  Heavy tobacco use. > 45 pack-year smoking history.  He reports that he drinks 2+  Alcohol beverages a day. He reports that he does not use illicit drugs.  Review of Systems: Slurred speech.  No numbness, tingling or weakness in face or extremities.  Denies HA, dizziness, blurred VA, or diploplia.  No N/V/D/C.  Has chronic altered sensation at tips fingers R hand and pain.  L hand dominant.  Examination:  Blood pressure 123/71, pulse 82, temperature 97.1 F (36.2 C), temperature source Oral, resp. rate 18, height 5\' 9"  (1.753 m), weight 73.483 kg (162 lb), SpO2 98.00%.  In general, lying on procedure table.    Cardiovascular: The patient has a regular rate and rhythm.  Distal pulses are diffcult to palpate.  Mental status:   The patient is oriented to person, place and time. Recent and remote  memory are intact. Attention span and concentration are normal. Language including repetition, naming, following commands are intact.Fund of knowledge of current and historical events, as well as vocabulary are normal. Mild dysarthria.  Cranial Nerves: Pupils are equally round and reactive to light. Visual fields full to confrontation. Extraocular movements are intact without nystagmus. Facial sensation and muscles of mastication are intact. Muscles of facial expression showed left facial droop. Hearing intact to bilateral finger rub. Tongue protrusion, uvula, palate midline.  Shoulder shrug intact. Mild dysarthria.   Motor: Strength 5/5 bilaterally.   The patient has normal bulk and tone of all extremities, no pronator drift.  There are no adventitious movements.   Reflexes:    Biceps  Triceps Brachioradialis Knee Ankle  Right 2+  2+  2+   2+ 2+  Left  2+  2+  2+   2+ 2+  Plantars: down going bilaterally.  Coordination:  Normal finger to nose.  Rapid alternating movements intact.  Sensation: intact to light touch throughout.  Discrimination intact..   Labs: Results for orders placed during the hospital encounter of 06/14/11 (from the past 48 hour(s))  GLUCOSE, CAPILLARY     Status: Normal   Collection Time   06/14/11 11:23 AM      Component Value Range Comment   Glucose-Capillary 92  70 - 99 (mg/dL)     Imaging: CT head w/o contrast pending.   Assessment Mr. Tyler [name] is a 60 y.o. male with acute onset dysarthria and L facial droop during PV angiogram.  Accoriding to operator, Dr. Arbie Cookey, carotids were not entered.  NIH 2, Modified Rankin Score .  Code Stroke was cancelled, tPA was not given de to NIH 2 and sx resolving.  Will initiate Stroke work up.  Stroke team will resume consulting role in the am.  Thank you for allowing [place] to assist in the management of this patient.  Recommendations: RN swallow eval. MRI/MRA head 2-D ECHO Carotid Dopplers Telemetry HgA1C, FLP Smoking Cessation consultation. ST/OT/PT. NPO until nursing swallow eval IV fluids Bed rest Intermittent compression device for feet Stroke team will follow as consult   LOS: 0 days   Marya Fossa PA-C [facility] [phone] 06/14/2011  11:59 AM

## 2011-06-14 NOTE — Op Note (Signed)
OPERATIVE REPORT  DATE OF SURGERY: 06/14/2011  PATIENT: Tyler [name], 60 y.o. male MRN: [id number]  DOB: 22-Apr-1951  PRE-OPERATIVE DIAGNOSIS: Right finger ischemia  POST-OPERATIVE DIAGNOSIS:  Same  PROCEDURE: Aortic arch and right arm arteriogram  SURGEON:  Gretta Began, M.D.   ANESTHESIA:  1% lidocaine, 1 mg Versed, 25 mg fentanyl IV  EBL: Minimal ml     BLOOD ADMINISTERED: None  DRAINS:  none  SPECIMEN: None  COUNTS CORRECT:  YES  PLAN OF CARE: Admit to 3000   PATIENT DISPOSITION:  PACU - hemodynamically stable  PROCEDURE DETAILS: The patient had been seen in our office by Dr. Myra Gianotti for evaluation of finger ischemia on his right hand. He is here today for an arteriogram for further evaluation to rule out proximal source.  The patient's right groin was prepped and draped in the usual sterile fashion. Using local anesthesia and a singlewall puncture the right common femoral artery was easily entered and a guidewire was passed up to the level of the aortic arch. A 5 French sheath was passed over the guidewire. A pigtail catheter was positioned at the descending aorta and a 15 LAO projection was undertaken. This arch injection showed no evidence of aortic or supra-aortic trunk disease. The pigtail catheter was exchanged over the guidewire and a Berenstein 2 catheter was used to select the innominate artery. Due to the anatomy guidewire would not pass into the subclavian and therefore this was exchanged for a Simmons 1 catheter. A Simmons 1 catheter was positioned in the descending aorta a guidewire was withdrawn to reform the configuration of the Cecil 1 and the innominate was easily cannulated. A guidewire passed into the subclavian on the right and the [facility] catheter was advanced into the area of the axilla.  At this time the patient had a clear episode of expressive aphasia. He had a left facial droop and some difficulty with speech area and this lasted for  approximately 1 minute.. Code stroke was initiated. The patient was hemodynamically stable and a stat blood sugar check revealed blood sugar of 92. Since the catheter was positioned the patient was stable and resolving we did complete a right upper extremity arteriogram. This showed no evidence of difficulty in the area of his right subclavian axillary or proximal brachial artery these. He did have a small radial ulnar arteries and these occluded at the level of the wrist. He did have collateral flow into the hand.  Impression: No evidence of aortic arch or supra-aortic trunk disease. No evidence of proximal brachial ovary disease. 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  Plan: Neurology will be consulted for further planning. Was discussed at length with the patient and his family present. They understand that medical management will be required for his small vessel occlusive disease.   Gretta Began, M.D. 06/14/2011 1:15 PM

## 2011-06-14 NOTE — Interval H&P Note (Signed)
History and Physical Interval Note:  06/14/2011 10:50 AM  Tyler [name]  has presented today for surgery, with the diagnosis of UE ischemia  The various methods of treatment have been discussed with the patient and family. After consideration of risks, benefits and other options for treatment, the patient has consented to  Procedure(s): ARCH AORTOGRAM as a surgical intervention .  The patients' history has been reviewed, patient examined, no change in status, stable for surgery.  I have reviewed the patients' chart and labs.  Questions were answered to the patient's satisfaction.     Larina Earthly

## 2011-06-14 NOTE — Progress Notes (Signed)
Speech Language/Pathology Speech Language Pathology Evaluation Patient Details Name: Tyler [name] MRN: [id number] DOB: [birth year] Today's Date: 06/14/2011  Problem List:  Patient Active Problem List  Diagnoses  . CROHN'S DISEASE  . Pain in hand   Past Medical History:  Past Medical History  Diagnosis Date  . Hyperlipidemia   . Crohn disease   . Anemia   . COPD (chronic obstructive pulmonary disease)   . Hypertension   . Kidney stones   . Arthritis   . Anxiety   . Joint pain    Past Surgical History:  Past Surgical History  Procedure Date  . Crohn's surgery 1979    SLP Assessment/Plan/Recommendation Assessment Clinical Impression Statement: Patient appears at baseline and WFL to WNL for all areas tested (cognitive, speech, language function). Patient's spouse judged his speech to be normal compared to baseline. SLP Recommendation/Assessment: Patient does not need any further Speech Language Pathology Services No Skilled Speech Therapy: Patient at baseline level of functioning;Patient is independent with all cognitive/linguistic skills SLP Recommendations Follow up Recommendations: None Equipment Recommended: None recommended by SLP Individuals Consulted Consulted and Agree with Results and Recommendations: Patient;Family member/caregiver  SLP Goals     SLP Evaluation Prior Functioning Cognitive/Linguistic Baseline: Within functional limits Lives With: Spouse  Cognition Overall Cognitive Status: Appears within functional limits for tasks assessed Arousal/Alertness: Awake/alert Orientation Level: Oriented X4 Memory: Appears intact Awareness: Appears intact Problem Solving: Appears intact Executive Function: Reasoning;Decision Making Reasoning: Appears intact Decision Making: Appears intact Safety/Judgment: Appears intact  Comprehension Auditory Comprehension Overall Auditory Comprehension: Appears within functional limits for tasks assessed Yes/No  Questions: Within Functional Limits Commands: Within Functional Limits Conversation: Complex Visual Recognition/Discrimination Discrimination: Not tested Reading Comprehension Reading Status: Not tested  Expression Expression Primary Mode of Expression: Verbal Verbal Expression Overall Verbal Expression: Appears within functional limits for tasks assessed Initiation: No impairment Repetition: No impairment Naming: Not tested Pragmatics: No impairment Non-Verbal Means of Communication: Not applicable  Oral/Motor Oral Motor/Sensory Function Overall Oral Motor/Sensory Function: Appears within functional limits for tasks assessed Labial ROM: Within Functional Limits Labial Symmetry: Within Functional Limits Labial Strength: Within Functional Limits Lingual ROM: Within Functional Limits Lingual Symmetry: Within Functional Limits Lingual Strength: Within Functional Limits Facial ROM: Within Functional Limits Facial Symmetry: Within Functional Limits Facial Strength: Within Functional Limits Velum: Within Functional Limits Mandible: Within Functional Limits Motor Speech Overall Motor Speech: Appears within functional limits for tasks assessed Respiration: Within functional limits Phonation: Normal Resonance: Within functional limits Articulation: Within functional limitis Intelligibility: Intelligible (Per spouse, no change in pt's speech) Motor Planning: Witnin functional limits Motor Speech Errors: Not applicable  Pablo Lawrence 06/14/2011, 4:50 PM

## 2011-06-14 NOTE — Code Documentation (Signed)
Called to [facility] Lab #8 at 1121 for Code Stroke.   60 year old male having arch aortogram procedure for chronic upper extremity numbness and pain.  During procedure patient had transient episode of slurred speech and left facial droop at  1115.  LSW was 1115.   Code stroke was called at 1121 - stroke team arrived at 1122 - Delice Bison Dejeneric PA present from Stroke Team.   Patient supine on procedure table - finishing last few films.  Dr. Arbie Cookey present with patient.  Patient alert - mild left facial droop and mild dysarthria noted.  Numbness in fingertips chronic but no loss sensation.  NIHSS 2.  Sheath in right groin stabilized and patient transported to CT scan without incident. Returned to cath lab recovery area - NIHSS remains 2 - patient feels his speech is better but not at baseline.  Facial droop remains.  Bp 126-78 SR 78 RR 18 O2 sat 98%  CBG 92 baseline at 1122.  Dr. Lyman Speller examined patient in cath lab RR area.  Code stroke cancelled per Dr. Lyman Speller.  Hand off report to Chip, RN in cath lab RR.  BP remains stable - SR.

## 2011-06-14 NOTE — H&P (View-Only) (Signed)
[facility] [place]   Patient name: Tyler [name] MRN: [id number] DOB: 06/20/1950 Sex: male     Chief Complaint  Patient presents with  . New Evaluation    Right Hand pain ( Has seen Rheumatology in [place])   Right fingers stay blanched and tingle. Tips turn purple especially in the cold.  No injury.    HISTORY OF PRESENT ILLNESS: The patient returns today having been sent from the emergency department. I last saw him in June of this year for evaluation of bilateral hand pain. He's been having this issue for greater than one year. He states that his hands and fingers will become white-tan painful but that'll usually resolves within several days. About a week he'll he had another episode and falling his right hand and has not improved. He states that his right hand his cold is somewhat bluish on appearance on his index finger there is a skin tear that has not healed in over a week but he states is from a splinter. He was initially evaluated with ultrasound and found to have triphasic radial artery signals bilaterally the right ulnar was biphasic he did have dampened waveforms in the first digit on the right but all of the waveforms were normal I felt this was likely some form of an autoimmune component given his Crohn's disease. He could also have been due to early Buerger's disease given his extensive smoking history. He does continue to smoke and he is taking an aspirin. He states that the pain is much worse now and is not getting any better. He is taking tramadol but this is minimal relief.  Past Medical History  Diagnosis Date  . Hyperlipidemia   . Crohn disease   . Anemia   . COPD (chronic obstructive pulmonary disease)   . Hypertension   . Kidney stones   . Arthritis   . Anxiety   . Joint pain     Past Surgical History  Procedure Date  . Crohn's surgery 1979    History   Social History  . Marital Status: Married    Spouse Name: N/A    Number of  Children: N/A  . Years of Education: N/A   Occupational History  . Construction    Social History Main Topics  . Smoking status: Current Everyday Smoker -- 1.5 packs/day for 30 years    Types: Cigarettes  . Smokeless tobacco: Never Used  . Alcohol Use: Yes     drinks 2 alcohol beverages per day  . Drug Use: No  . Sexually Active:    Other Topics Concern  . 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    Family History  Problem Relation Age of Onset  . Colon cancer      no family Hx of    Allergies as of 06/05/2011  . (No Known Allergies)    Current Outpatient Prescriptions on File Prior to Visit  Medication Sig Dispense Refill  . albuterol (PROVENTIL HFA;VENTOLIN HFA) 108 (90 BASE) MCG/ACT inhaler Inhale 2 puffs into the lungs every 6 (six) hours as needed.        . amitriptyline (ELAVIL) 50 MG tablet Take 50 mg by mouth at bedtime.        . aspirin 81 MG tablet Take 81 mg by mouth daily.        .   losartan (COZAAR) 50 MG tablet Take 50 mg by mouth daily.        . metoprolol (TOPROL-XL) 100 MG 24 hr tablet Take 100 mg by mouth daily.        . predniSONE (DELTASONE) 10 MG tablet Take 10 mg by mouth every other day.        . tiotropium (SPIRIVA) 18 MCG inhalation capsule Place 18 mcg into inhaler and inhale daily.        . traMADol (ULTRAM) 50 MG tablet Take 50 mg by mouth. Take 1 tablet by mouth 3 times daily as needed       . azaTHIOprine (IMURAN) 50 MG tablet Take 50 mg by mouth. Take 3 pills every morning       . budesonide (ENTOCORT EC) 3 MG 24 hr capsule Take 3 mg by mouth. Take 3 capsules daily       . lisinopril-hydrochlorothiazide (PRINZIDE,ZESTORETIC) 10-12.5 MG per tablet Take 1 tablet by mouth daily.           REVIEW OF SYSTEMS: Cardiovascular: No chest pain, chest pressure, palpitations, orthopnea, or dyspnea on  exertion. No claudication or rest pain,  No history of DVT or phlebitis. Pulmonary: No productive cough, asthma or wheezing. Neurologic: No weakness, paresthesias, aphasia, or amaurosis. No dizziness. Hematologic: No bleeding problems or clotting disorders. Musculoskeletal: No joint pain or joint swelling. Gastrointestinal: No blood in stool or hematemesis Genitourinary: No dysuria or hematuria. Psychiatric:: No history of major depression. Integumentary: No rashes Constitutional: No fever or chills.  PHYSICAL EXAMINATION:   Vital signs are BP 134/91  Pulse 76  Temp(Src) 98.1 F (36.7 C) (Oral)  Ht 5' 9" (1.753 m)  Wt 162 lb (73.483 kg)  BMI 23.92 kg/m2  SpO2 100% General: The patient appears their stated age. HEENT:  No gross abnormalities Pulmonary:  Non labored breathing Musculoskeletal: There are no major deformities. Neurologic: No focal weakness or paresthesias are detected, Skin: There's a bluish discoloration on the right index middle and ring finger. There is a slight skin tear on the right index finger without evidence of infection Psychiatric: The patient has normal affect. Cardiovascular: There is a regular rate and rhythm without significant murmur appreciated. Palpable radial pulse bilaterally   Diagnostic Studies None  Assessment: Ischemic changes to the right first second and third finger Plan: I Am scheduling the patient for formal angiogram to get good views of his hand. In addition I want to rule out any form of aneurysmal disease  further proximal. Assuming that this is negative I am going to refer him to a hand surgeon. We discussed the need for smoking cessation. He'll continue his antiplatelet therapy. His angiogram has been scheduled for Wednesday, December 26 this will be performed by my partner Dr. Todd Early. The patient cannot have this done sooner because he is going out of town for work-related issues this entire week. I did give and 30 oxycodone  tablets a day to help with his pain control  V. Wells Gerene Nedd IV, M.D. [facility] [place] Office: [phone] Pager:  [phone]    

## 2011-06-15 ENCOUNTER — Inpatient Hospital Stay (HOSPITAL_COMMUNITY): Payer: Managed Care, Other (non HMO)

## 2011-06-15 DIAGNOSIS — I6789 Other cerebrovascular disease: Secondary | ICD-10-CM

## 2011-06-15 LAB — CBC
HCT: 39 % (ref 39.0–52.0)
MCH: 27.4 pg (ref 26.0–34.0)
MCV: 87.4 fL (ref 78.0–100.0)
Platelets: 265 10*3/uL (ref 150–400)
RBC: 4.46 MIL/uL (ref 4.22–5.81)
RDW: 14.7 % (ref 11.5–15.5)

## 2011-06-15 LAB — BASIC METABOLIC PANEL
BUN: 8 mg/dL (ref 6–23)
CO2: 29 mEq/L (ref 19–32)
Calcium: 9.1 mg/dL (ref 8.4–10.5)
Creatinine, Ser: 0.91 mg/dL (ref 0.50–1.35)

## 2011-06-15 LAB — LIPID PANEL
Cholesterol: 200 mg/dL (ref 0–200)
VLDL: 26 mg/dL (ref 0–40)

## 2011-06-15 MED ORDER — ASPIRIN 325 MG PO TABS: 325.0000 mg | ORAL_TABLET | Freq: Every day | ORAL | Status: DC

## 2011-06-15 MED ORDER — LORAZEPAM 2 MG/ML IJ SOLN
2.0000 mg | Freq: Once | INTRAMUSCULAR | Status: AC
  Administered 2011-06-15: 2 mg via INTRAVENOUS
  Filled 2011-06-15: qty 1

## 2011-06-15 NOTE — Progress Notes (Addendum)
Subjective: Interval History: has no complaint of weakness or speech difficulty.  Wants to go home..   Objective: Vital signs in last 24 hours: Temp:  [97.1 F (36.2 C)-98.3 F (36.8 C)] 98.1 F (36.7 C) (12/27 0600) Pulse Rate:  [64-91] 79  (12/27 0600) Resp:  [18-20] 20  (12/27 0600) BP: (90-146)/(62-90) 110/74 mmHg (12/27 0600) SpO2:  [96 %-98 %] 98 % (12/27 0600) Weight:  [162 lb (73.483 kg)] 162 lb (73.483 kg) (12/26 0838)  Intake/Output from previous day: 12/26 0701 - 12/27 0700 In: 1776.3 [P.O.:360; I.V.:1316.3; IV Piggyback:100] Out: -  Intake/Output this shift:    Neurologic: Grossly normal Right groin punct site stable Lab Results:  [facility] 06/15/11 0525 06/14/11 1712  WBC 9.0 9.2  HGB 12.2* 12.6*  HCT 39.0 38.8*  PLT 265 278   BMET  Basename 06/15/11 0525 06/14/11 1211  NA 140 138  K 4.4 3.9  CL 103 104  CO2 29 24  GLUCOSE 113* 85  BUN 8 10  CREATININE 0.91 0.81  CALCIUM 9.1 9.2    Studies/Results: Dg Chest 2 View  06/14/2011  *RADIOLOGY REPORT*  Clinical Data: Suspected during cardiac cath.  CHEST - 2 VIEW  Comparison: None.  Findings: Lungs are clear. No pleural effusion or pneumothorax.  Cardiomediastinal silhouette is within normal limits.  Degenerative changes of the visualized thoracolumbar spine.  IMPRESSION: No evidence of acute cardiopulmonary disease.  Original Report Authenticated By: Charline Bills, M.D.   Ct Head Wo Contrast  06/14/2011  *RADIOLOGY REPORT*  Clinical Data: Stroke.  Cerebrovascular accident.  CT HEAD WITHOUT CONTRAST  Technique:  Contiguous axial images were obtained from the base of the skull through the vertex without contrast.  Comparison: 08/12/2007.  Findings: There is intravascular contrast, presumably associated with catheterization.  This decreases the sensitivity for hemorrhage.  There is chronic ischemic white matter disease that appears similar to the prior examination.  Low attenuation in the left sub insular  region may represent a small lacunar infarct. This is age indeterminant but did not appear present on the prior exam of 08/12/2007.  Left middle cerebral artery appears opacified. There is no mass lesion, midline shift, hydrocephalus or gross intraparenchymal hemorrhage. No territorial infarct is identified. Bone windows appear within normal limits.  Hypoplastic left maxillary sinus with sclerosis suggest chronic paranasal sinus disease.  IMPRESSION: 1.  Small area of low attenuation in the left sub insular region is consistent with an age indeterminate lacunar infarct. 2.  Although this study is performed without IV infusion, there is contrast in the intravascular system likely associated with catheterization. 3.  Chronic ischemic white matter disease appears little changed compared to prior.  Original Report Authenticated By: Andreas Newport, M.D.   Mr Brain Ltd W/o Cm  06/14/2011  *RADIOLOGY REPORT*  Clinical Data: The patient was undergoing angiogram for upper extremity symptoms and developed dysarthria and left facial droop during the procedure.  MRI HEAD WITHOUT CONTRAST  Technique:  Multiplanar, multiecho pulse sequences of the brain and surrounding structures were obtained according to standard protocol without intravenous contrast.  Comparison: CT head earlier in the day.  Findings: The patient was unable to cooperate to undergo the entire imaging exam.  Only sagittal T1 and axial diffusion weighted sequences were obtained.  There is a moderately extensive right frontal cortical infarct, extending from the frontal operculum upward to within 3 cm of the midline. There is adjacent involvement of the subcortical white matter.  A smaller subcentimeter frontal cortical infarct is seen just  anterior to this.  The findings are consistent with embolic occlusion of one or more branches of the right middle cerebral artery.  Within limits of this technique, there is no visible hemorrhage. There is no midline shift.   Patency of the proximal vasculature is not established on this limited exam.  There is no definite left hemisphere acute infarction nor is the brainstem involved.  IMPRESSION: Limited exam.  The study was truncated prematurely.  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 There is no visible hemorrhage based on review of prior CT.  Original Report Authenticated By: Elsie Stain, M.D.   Anti-infectives: Anti-infectives    None      Assessment/Plan: s/p Procedure(s): ARCH AORTOGRAM UNILATERAL UPPER EXTREMEITY ANGIOGRAM Right MCA embolic event during R arm angio.  Speach eval finds pt at baseline pre event level.OK for d/c from vasc standpoint if OK with neurology.  F/U in office with Dr Myra Gianotti regarding finger ischemia.   LOS: 1 day   EARLY,TODD F 06/15/2011, 7:23 AM   2D Echo results: Study Conclusions  Left ventricle: The cavity size was normal. Wall thickness was normal. Systolic function was normal. The estimated ejection fraction was in the range of 60% to 65%. Wall motion was normal; there were no regional wall motion abnormalities. Doppler parameters are consistent with abnormal left ventricular relaxation (grade 1 diastolic dysfunction). Transthoracic echocardiography. M-mode, complete 2D, spectral Doppler, and color Doppler. Height: Height: 175.3cm. Height: 69in. Weight: Weight: 73.5kg. Weight: 161.7lb. Body mass index: BMI: 23.9kg/m^2. Body surface area: BSA: 1.76m^2. Blood pressure: 110/74. Patient status: Inpatient. Location: Echo

## 2011-06-15 NOTE — Progress Notes (Signed)
Pt smokes 1 1/2 ppd and says he doesn't really want to quit but he knows he has to and has accepted this fact. He has a Rx from his MD for Chantix which he plans on filling upon discharge. Discussed chantix use instructions and side effects with the pt. Stressed optimum length of use as being 6 months and minimum being 3 months. Pt in action stage. Referred to 1-800 quit now for f/u and support. Discussed oral fixation substitutes, second hand smoke and in home smoking policy. Reviewed and gave pt Written education/contact information.

## 2011-06-15 NOTE — Progress Notes (Signed)
Stroke Team Progress Note  SUBJECTIVE Tyler [name] is an 60 y.o. male Undergoing PV angiogram for R hand ischemia. During case, pt developed mild dysarthria and L facial droop. No extremity weakness. Last seen normal 11:15. Code Stroke called. NIH 2. Modified Rankin Score 0. Head CT neg of hemorrhage. Formal report pending. Dr. Lyman Speller examined the patient and cancelled Code Stroke for NIH 2 and sx resolving.  His wife is at the bedside. Overall he feels his condition is gradually improving. Current smoker.  OBJECTIVE Most recent Vital Signs: Temp: 98.1 F (36.7 C) (12/27 0600) Temp src: Oral (12/27 0600) BP: 110/74 mmHg (12/27 0600) Pulse Rate: 79  (12/27 0600) Respiratory Rate: 20 O2 Saturation: 98%  CBG (last 3)   Basename 06/14/11 1123  GLUCAP 92   Intake/Output from previous day: 12/26 0701 - 12/27 0700 In: 1776.3 [P.O.:360; I.V.:1316.3; IV Piggyback:100] Out: 850 [Urine:850]  IV Fluid Intake:     . dextrose 5 % and 0.45% NaCl 75 mL/hr at 06/15/11 0552  . DISCONTD: sodium chloride 100 mL/hr at 06/14/11 0851   Medications    . amitriptyline  50 mg Oral QHS  . amLODipine  10 mg Oral Daily  . aspirin  300 mg Rectal Daily   Or  . aspirin  325 mg Oral Daily  . enoxaparin  40 mg Subcutaneous Daily  . famotidine (PEPCID) IV  20 mg Intravenous Q12H  . metoprolol  100 mg Oral QHS  . potassium chloride  20-40 mEq Oral Once  . predniSONE  10 mg Oral Q48H  . tiotropium  18 mcg Inhalation Daily  . DISCONTD: aspirin  81 mg Oral Daily  . DISCONTD: enoxaparin  40 mg Subcutaneous Q24H   Diet:  Cardiac thin liquids Activity:  Up with assistance DVT Prophylaxis:  Lovenox 40 mg sq daily   Studies: CBC    Component Value Date/Time   WBC 9.0 06/15/2011 0525   RBC 4.46 06/15/2011 0525   HGB 12.2* 06/15/2011 0525   HCT 39.0 06/15/2011 0525   PLT 265 06/15/2011 0525   MCV 87.4 06/15/2011 0525   MCH 27.4 06/15/2011 0525   MCHC 31.3 06/15/2011 0525   RDW 14.7 06/15/2011  0525   LYMPHSABS 1.1 08/13/2009 1452   MONOABS 0.4 08/13/2009 1452   EOSABS 0.1 08/13/2009 1452   BASOSABS 0.0 08/13/2009 1452   CMP    Component Value Date/Time   NA 140 06/15/2011 0525   K 4.4 06/15/2011 0525   CL 103 06/15/2011 0525   CO2 29 06/15/2011 0525   GLUCOSE 113* 06/15/2011 0525   BUN 8 06/15/2011 0525   CREATININE 0.91 06/15/2011 0525   CALCIUM 9.1 06/15/2011 0525   PROT 6.6 08/13/2009 1452   ALBUMIN 3.4* 08/13/2009 1452   AST 24 08/13/2009 1452   ALT 16 08/13/2009 1452   ALKPHOS 52 08/13/2009 1452   BILITOT 1.0 08/13/2009 1452   GFRNONAA >90 06/15/2011 0525   GFRAA >90 06/15/2011 0525   COAGS Lab Results  Component Value Date   INR 1.00 06/14/2011   Lipid Panel    Component Value Date/Time   CHOL 200 06/15/2011 0525   TRIG 130 06/15/2011 0525   HDL 45 06/15/2011 0525   CHOLHDL 4.4 06/15/2011 0525   VLDL 26 06/15/2011 0525   LDLCALC 129* 06/15/2011 0525   HgbA1C  No results found for this basename: HGBA1C   Urine Drug Screen  No results found for this basename: labopia, cocainscrnur, labbenz, amphetmu, thcu, labbarb    Alcohol  Level No results found for this basename: eth     Dg Chest 2 View  06/14/2011  *RADIOLOGY REPORT*  Clinical Data: Suspected during cardiac cath.  CHEST - 2 VIEW  Comparison: None.  Findings: Lungs are clear. No pleural effusion or pneumothorax.  Cardiomediastinal silhouette is within normal limits.  Degenerative changes of the visualized thoracolumbar spine.  IMPRESSION: No evidence of acute cardiopulmonary disease.  Original Report Authenticated By: Charline Bills, M.D.   Ct Head Wo Contrast  06/14/2011  *RADIOLOGY REPORT*  Clinical Data: Stroke.  Cerebrovascular accident.  CT HEAD WITHOUT CONTRAST  Technique:  Contiguous axial images were obtained from the base of the skull through the vertex without contrast.  Comparison: 08/12/2007.  Findings: There is intravascular contrast, presumably associated with catheterization.  This  decreases the sensitivity for hemorrhage.  There is chronic ischemic white matter disease that appears similar to the prior examination.  Low attenuation in the left sub insular region may represent a small lacunar infarct. This is age indeterminant but did not appear present on the prior exam of 08/12/2007.  Left middle cerebral artery appears opacified. There is no mass lesion, midline shift, hydrocephalus or gross intraparenchymal hemorrhage. No territorial infarct is identified. Bone windows appear within normal limits.  Hypoplastic left maxillary sinus with sclerosis suggest chronic paranasal sinus disease.  IMPRESSION: 1.  Small area of low attenuation in the left sub insular region is consistent with an age indeterminate lacunar infarct. 2.  Although this study is performed without IV infusion, there is contrast in the intravascular system likely associated with catheterization. 3.  Chronic ischemic white matter disease appears little changed compared to prior.  Original Report Authenticated By: Andreas Newport, M.D.   Mr Brain Ltd W/o Cm  06/14/2011  *RADIOLOGY REPORT*  Clinical Data: The patient was undergoing angiogram for upper extremity symptoms and developed dysarthria and left facial droop during the procedure.  MRI HEAD WITHOUT CONTRAST  Technique:  Multiplanar, multiecho pulse sequences of the brain and surrounding structures were obtained according to standard protocol without intravenous contrast.  Comparison: CT head earlier in the day.  Findings: The patient was unable to cooperate to undergo the entire imaging exam.  Only sagittal T1 and axial diffusion weighted sequences were obtained.  There is a moderately extensive right frontal cortical infarct, extending from the frontal operculum upward to within 3 cm of the midline. There is adjacent involvement of the subcortical white matter.  A smaller subcentimeter frontal cortical infarct is seen just anterior to this.  The findings are  consistent with embolic occlusion of one or more branches of the right middle cerebral artery.  Within limits of this technique, there is no visible hemorrhage. There is no midline shift.  Patency of the proximal vasculature is not established on this limited exam.  There is no definite left hemisphere acute infarction nor is the brainstem involved.  IMPRESSION: Limited exam.  The study was truncated prematurely.  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 There is no visible hemorrhage based on review of prior CT.  Original Report Authenticated By: Elsie Stain, M.D.   MRA of the brain  Not ordered   2D Echocardiogram  ordered, pending  Carotid Doppler  No internal carotid artery stenosis bilaterally. Vertebrals with antegrade flow bilaterally.   Physical Exam  -----  Patient is alert and cooperative. Very minimal left facial droop is noted. Speech is normal, no aphasia or dysarthria is noted.  The patient has good strength all  4 extremities. Patient has good finger-nose-finger bilaterally, but there is slight drift of the right upper extremity. No drift is seen on the left.  Deep tendon reflexes are symmetric.  ASSESSMENT Tyler [name] is a 60 y.o. male with an acute R MCA infarct, ? secondary to complication from angiogram Need to look at intracranial vasculature for other possible etiologies/risk factor. On aspirin 325 mg orally every day for secondary stroke prevention.  The patient will undergo a MRA of the head, and if this is unremarkable, the patient may be discharged today. Patient will be maintained on antiplatelet agents.  Hospital day # 1  TREATMENT/PLAN Continue aspirin 325 mg orally every day for secondary stroke prevention. Check MRA of the head. If no significant abnormailites, ok for discharge following this study.   Joaquin Music, ANP-BC, GNP-BC [facility] Pager: [phone] 06/15/2011 11:04 AM  Dr. Delia Heady,  Stroke Center Medical Director, has personally reviewed chart, pertinent data, examined the patient and developed the plan of care.

## 2011-06-15 NOTE — Progress Notes (Signed)
Echocardiogram 2D Echocardiogram has been performed.  Katheren Puller 06/15/2011, 10:41 AM

## 2011-06-15 NOTE — Progress Notes (Signed)
Physical Therapy Evaluation Patient Details Name: Tyler [name] MRN: [id number] DOB: Nov 20, 1950 Today's Date: 06/15/2011  Problem List:  Patient Active Problem List  Diagnoses  . CROHN'S DISEASE  . Pain in hand    Past Medical History:  Past Medical History  Diagnosis Date  . Hyperlipidemia   . Crohn disease   . Anemia   . COPD (chronic obstructive pulmonary disease)   . Hypertension   . Kidney stones   . Arthritis   . Anxiety   . Joint pain    Past Surgical History:  Past Surgical History  Procedure Date  . Crohn's surgery 1979    PT Assessment/Plan/Recommendation PT Assessment Clinical Impression Statement: Patient presents with resolution of stroke symptoms that occured with Rt hand PV angiogram. Patients initial NIHSS was 2 with symptoms predominately speech related. Patient without skilled PT needs at this time. I have educated him on warning signs of stroke, risk factors, and the need for regular exercise as per heart association guidelines.  PT Recommendation/Assessment: Patent does not need any further PT services No Skilled PT: Patient is independent with all acitivity/mobility;All education completed PT Recommendation Follow Up Recommendations: None Equipment Recommended: None recommended by PT  PT Evaluation Precautions/Restrictions    Prior Functioning  Home Living Lives With: Spouse Type of Home: House Home Layout: One level Home Access: Stairs to enter [employer] of Steps: 3 Home Adaptive Equipment: None Prior Function Level of Independence: Independent with basic ADLs;Independent with homemaking with ambulation Driving: Yes 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 Arousal/Alertness: Awake/alert Overall Cognitive Status: Appears within functional limits for tasks assessed Orientation Level: Oriented X4 Sensation/Coordination Sensation Light Touch: Appears Intact (for lower  extremities) Proprioception: Appears Intact Coordination Gross Motor Movements are Fluid and Coordinated: Yes Fine Motor Movements are Fluid and Coordinated: Yes Extremity Assessment RLE Assessment RLE Assessment: Within Functional Limits LLE Assessment LLE Assessment: Within Functional Limits Mobility (including Balance) Bed Mobility Bed Mobility: Yes Rolling Left: 7: Independent Left Sidelying to Sit: 7: Independent Sitting - Scoot to Edge of Bed: 7: Independent Sit to Supine - Left: 7: Independent Transfers Transfers: Yes Sit to Stand: 7: Independent Stand to Sit: 7: Independent Ambulation/Gait Ambulation/Gait: Yes Ambulation/Gait Assistance: 7: Independent Ambulation Distance (Feet): 400 Feet Assistive device: None Gait Pattern: Within Functional Limits Stairs: Yes Stairs Assistance: 7: Independent Stair Management Technique: No rails Number of Stairs: 5   Posture/Postural Control Posture/Postural Control: No significant limitations Dynamic Gait Index Level Surface: Normal Change in Gait Speed: Normal Gait with Horizontal Head Turns: Normal Gait with Vertical Head Turns: Normal Gait and Pivot Turn: Normal Step Over Obstacle: Normal Step Around Obstacles: Normal Steps: Normal Total Score: 24  End of Session PT - End of Session Equipment Utilized During Treatment: Gait belt Activity Tolerance: Patient tolerated treatment well Patient left: in bed;with call bell in reach General Behavior During Session: [facility] for tasks performed Cognition: [facility] for tasks performed  Edwyna Perfect, PT  Pager [phone]  06/15/2011, 8:19 AM

## 2011-06-15 NOTE — Progress Notes (Signed)
*  PRELIMINARY RESULTS*  Carotid Dopplers has been performed.  No significant ICA stenosis bilaterally. Vertebral arteries are patent with antegrade flow.  Mila Homer 06/15/2011, 9:45 AM

## 2011-06-16 NOTE — Discharge Summary (Signed)
[facility] Discharge Summary   Patient ID:  Tyler [name] MRN: [id number] DOB/AGE: 58/20/1952 57 y.o.  Admit date: 06/14/2011 Discharge date: 06/16/2011 Date of Surgery: 06/14/2011 Surgeon: Surgeon(s): Larina Earthly, MD  Admission Diagnosis: UE ischemia  Discharge Diagnoses:  UE ischemia  Secondary Diagnoses: Past Medical History  Diagnosis Date  . Hyperlipidemia   . Crohn disease   . Anemia   . COPD (chronic obstructive pulmonary disease)   . Hypertension   . Kidney stones   . Arthritis   . Anxiety   . Joint pain     Procedures: Procedure(s): ARCH AORTOGRAM UNILATERAL UPPER EXTREMEITY ANGIOGRAM  Discharged Condition: good  HISTORY OF PRESENT ILLNESS:  The patient returns today having been sent from the emergency department. I last saw him in June of this year for evaluation of bilateral hand pain. He's been having this issue for greater than one year. He states that his hands and fingers will become white-tan painful but that'll usually resolves within several days. About a week he'll he had another episode and falling his right hand and has not improved. He states that his right hand his cold is somewhat bluish on appearance on his index finger there is a skin tear that has not healed in over a week but he states is from a splinter. He was initially evaluated with ultrasound and found to have triphasic radial artery signals bilaterally the right ulnar was biphasic he did have dampened waveforms in the first digit on the right but all of the waveforms were normal I felt this was likely some form of an autoimmune component given his Crohn's disease. He could also have been due to early Buerger's disease given his extensive smoking history. He does continue to smoke and he is taking an aspirin. He states that the pain is much worse now and is not getting any better. He is taking tramadol but this is minimal relief.    Hospital Course:  Tyler [name]  is a 60 y.o. male is S/P Right Procedure(s): ARCH AORTOGRAM UNILATERAL UPPER EXTREMEITY ANGIOGRAM Pt. Ambulating, voiding and taking PO diet without difficulty. Pt pain controlled with PO pain meds. Labs as below Complications:pt. was admitted because of CVA post procedure with dysarthria which has resolved to baseline  Consults: Neurology    Significant Diagnostic Studies: CBC    Component Value Date/Time   WBC 9.0 06/15/2011 0525   RBC 4.46 06/15/2011 0525   HGB 12.2* 06/15/2011 0525   HCT 39.0 06/15/2011 0525   PLT 265 06/15/2011 0525   MCV 87.4 06/15/2011 0525   MCH 27.4 06/15/2011 0525   MCHC 31.3 06/15/2011 0525   RDW 14.7 06/15/2011 0525   LYMPHSABS 1.1 08/13/2009 1452   MONOABS 0.4 08/13/2009 1452   EOSABS 0.1 08/13/2009 1452   BASOSABS 0.0 08/13/2009 1452    BMET    Component Value Date/Time   NA 140 06/15/2011 0525   K 4.4 06/15/2011 0525   CL 103 06/15/2011 0525   CO2 29 06/15/2011 0525   GLUCOSE 113* 06/15/2011 0525   BUN 8 06/15/2011 0525   CREATININE 0.91 06/15/2011 0525   CALCIUM 9.1 06/15/2011 0525   GFRNONAA >90 06/15/2011 0525   GFRAA >90 06/15/2011 0525    COAG Lab Results  Component Value Date   INR 1.00 06/14/2011     Disposition:  Discharge to :Home Discharge Orders    Future Appointments: Provider: Department: Dept Phone: Center:   07/03/2011 1:15 PM V Durene Cal,  MD [facility] [phone] [facility]   09/04/2011 8:45 AM Tally Due, MD [facility] [phone] [facility]     Future Orders Please Complete By Expires   Resume previous diet      Call MD for:  temperature >100.5      Call MD for:  redness, tenderness, or signs of infection (pain, swelling, bleeding, redness, odor or green/yellow discharge around incision site)      Call MD for:  severe or increased pain, loss or decreased feeling  in affected limb(s)      Discharge instructions      Comments:   Stroke Prevention Some medical conditions and some behaviors are  associated with an increased chance of having a stroke. You may prevent a stroke by making healthy choices and managing medical conditions. You can reduce your risk of having a stroke by: Staying physically active. It is recommended that you get at least 30 minutes of activity on most or all days.  Stop smoking.  Limiting alcohol use. Moderate alcohol use is considered to be: No more than 2 drinks per day for men. No more than 1 drink per day for non-pregnant women.  DIET.  A low fat, low cholesterol, low salt and high fiber diet with servings of fruits and vegetables daily will help .  Managing your cholesterol levels.. Take any prescribed medications to control cholesterol as directed by your caregiver.  Managing your diabetes. Diabetic diet as recommended by the ADA. Take any prescribed medications to control diabetes as directed by your caregiver.  Controlling your high blood pressure (hypertension). It is very important to take any prescribed medications to control hypertension .  Aspirin: Take Aspirin daily as prescribed by your physician. Do not take aspirin with blood thinners unless prescribed by your Physician You may be on "blood thinners" (anticoagulants) Coumadin, Plavix, Agrennox, Effient. All these medications are helpful in reducing the risk of forming abnormal blood clots. If you have the irregular heart rhythm of atrial fibrillation, you may be on a blood thinner. Be sure you understand all your medication instructions.   SIGNS OF STROKE: SEEK IMMEDIATE MEDICAL CARE IF: You have sudden weakness or numbness of the face, arm, or leg, especially on 1 side of the body.  You have sudden confusion.  You have trouble speaking (aphasia) or understanding.  You have sudden trouble seeing in 1 or both eyes.  You have sudden trouble walking.  You have dizziness.  You have a loss of balance or coordination.  You have a sudden severe headache with no known cause.    You have new chest pain,  angina, or an irregular heartbeat.   ANY OF THESE SYMPTOMS MAY REPRESENT A SERIOUS PROBLEM THAT IS AN EMERGENCY. Do not wait to see if the symptoms will go away. Get medical help right away. Call your local emergency services (911 in [place] DO NOT drive yourself to the hospital. Stroke Prevention Some medical conditions and some behaviors are associated with an increased chance of having a stroke. You may prevent a stroke by making healthy choices and managing medical conditions. You can reduce your risk of having a stroke by: Staying physically active. It is recommended that you get at least 30 minutes of activity on most or all days.  Not smoking.  Limiting alcohol use. Moderate alcohol use is considered to be:  No more than 2 drinks per day for men.  No more than 1 drink per day for nonpregnant women.  Eating healthy  foods. Certain diets may be prescribed to address high blood pressure, high cholesterol, diabetes, or obesity. A diet that includes 5 or more servings of fruits and vegetables a day may reduce the risk of stroke.  Managing your cholesterol levels. A low saturated fat, low trans fat, low cholesterol, and high fiber diet may control cholesterol levels. Take any prescribed medications to control cholesterol as directed by your caregiver.  Managing your diabetes. A controlled carbohydrate, controlled sugar diet is recommended to manage diabetes. Take any prescribed medications to control diabetes as directed by your caregiver.  Controlling your high blood pressure (hypertension). A low salt (sodium), low saturated fat, low trans fat, and low cholesterol diet is recommended to manage high blood pressure. Take any prescribed medications to control hypertension as directed by your caregiver.  Maintaining a healthy weight. A reduced calorie, low sodium, low saturated fat, low trans fat, low cholesterol diet is recommended to manage weight.  Stopping drug abuse.  Taking medications as directed  by your caregiver. For some individuals, aspirin or "blood thinners" (anticoagulants) are helpful in reducing the risk of forming abnormal blood clots that can lead to stroke. If you have the irregular heart rhythm of atrial fibrillation, you should be on a blood thinner unless there is a good reason you cannot take them. Be sure you understand all your medication instructions.  SEEK IMMEDIATE MEDICAL CARE IF: You have sudden weakness or numbness of the face, arm, or leg, especially on 1 side of the body.  You have sudden confusion.  You have trouble speaking (aphasia) or understanding.  You have sudden trouble seeing in 1 or both eyes.  You have sudden trouble walking.  You have a loss of balance or coordination.  You have a sudden severe headache with no known cause.    ANY OF THESE SYMPTOMS MAY REPRESENT A SERIOUS PROBLEM THAT IS AN EMERGENCY. Do not wait to see if the symptoms will go away. Get medical help right away. Call your local emergency services (911 in [place] DO NOT drive yourself to the hospital. Document Released: 07/13/2004 Document Re-Released: 11/23/2009 [facility] Patient Information 2011 [place], [place].   Increase activity slowly      Comments:   Walk with assistance use walker or cane as needed   May shower       Remove dressing in 48 hours      may wash over wound with mild soap and water         [place], [place]  Home Medication Instructions [id number]   Printed on:06/16/11 1042  Medication Information                    amitriptyline (ELAVIL) 50 MG tablet Take 50 mg by mouth at bedtime.             metoprolol (TOPROL-XL) 100 MG 24 hr tablet Take 100 mg by mouth at bedtime.            traMADol (ULTRAM) 50 MG tablet Take 50 mg by mouth every 6 (six) hours as needed. For pain.           tiotropium (SPIRIVA) 18 MCG inhalation capsule Place 18 mcg into inhaler and inhale daily.             albuterol (PROVENTIL HFA;VENTOLIN HFA) 108 (90 BASE) MCG/ACT  inhaler Inhale 2 puffs into the lungs every 6 (six) hours as needed. For shortness of breath  predniSONE (DELTASONE) 10 MG tablet Take 10 mg by mouth every other day.             amLODipine (NORVASC) 10 MG tablet Take 10 mg by mouth daily.             aspirin 325 MG tablet Take 1 tablet (325 mg total) by mouth daily.            Verbal and written Discharge instructions given to the patient. Wound care per Discharge AVS Follow-up Information    Follow up with Myra Gianotti IV, Lala Lund, MD in 2 weeks. (office will arrange - called)    Contact information:   [address] [place] [address] [phone]          Signed: Marlowe Shores 06/16/2011, 10:42 AM

## 2011-06-19 NOTE — Progress Notes (Signed)
Utilization review completed. Ermin Parisien, RN, BSN. 06/19/11  

## 2011-06-20 DIAGNOSIS — I2699 Other pulmonary embolism without acute cor pulmonale: Secondary | ICD-10-CM

## 2011-06-20 HISTORY — DX: Other pulmonary embolism without acute cor pulmonale: I26.99

## 2011-06-29 ENCOUNTER — Ambulatory Visit (INDEPENDENT_AMBULATORY_CARE_PROVIDER_SITE_OTHER): Payer: Managed Care, Other (non HMO)

## 2011-06-29 DIAGNOSIS — I10 Essential (primary) hypertension: Secondary | ICD-10-CM

## 2011-06-29 DIAGNOSIS — F17200 Nicotine dependence, unspecified, uncomplicated: Secondary | ICD-10-CM

## 2011-06-29 DIAGNOSIS — Z79899 Other long term (current) drug therapy: Secondary | ICD-10-CM

## 2011-06-29 DIAGNOSIS — I7090 Unspecified atherosclerosis: Secondary | ICD-10-CM

## 2011-06-30 ENCOUNTER — Encounter: Payer: Self-pay | Admitting: Surgery

## 2011-07-03 ENCOUNTER — Encounter: Payer: Self-pay | Admitting: Surgery

## 2011-07-03 ENCOUNTER — Ambulatory Visit (INDEPENDENT_AMBULATORY_CARE_PROVIDER_SITE_OTHER): Payer: Managed Care, Other (non HMO) | Admitting: Surgery

## 2011-07-03 VITALS — BP 145/94 | HR 120 | Resp 16 | Ht 69.0 in | Wt 161.0 lb

## 2011-07-03 DIAGNOSIS — I6529 Occlusion and stenosis of unspecified carotid artery: Secondary | ICD-10-CM | POA: Insufficient documentation

## 2011-07-03 NOTE — Progress Notes (Signed)
[facility] [place]   Patient name: Tyler [name] MRN: [id number] DOB: 06-07-1951 Sex: male     Chief Complaint  Patient presents with  . Carotid    f/up  right hand gets numb    HISTORY OF PRESENT ILLNESS: Patient comes in today for followup of bilateral hand pain. He continues to have ulceration of the tip of the right middle finger. Patient suffers from severe pain in this area. He underwent aortic arch and right arm angiogram on December 26 by Dr. early. This was complicated by a stroke. His symptoms have completely resolved. He did see Dr. Gilman Schmidt, a a hand surgeon for further evaluation. The patient states that he was recommended to go to [facility]. He continues to smoke.  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  Past Medical History  Diagnosis Date  . Hyperlipidemia   . Crohn disease   . Anemia   . COPD (chronic obstructive pulmonary disease)   . Hypertension   . Kidney stones   . Arthritis   . Anxiety   . Joint pain     Past Surgical History  Procedure Date  . Crohn's surgery 1979    History   Social History  . Marital Status: Married    Spouse Name: N/A    Number of Children: N/A  . Years of Education: N/A   Occupational History  . Construction    Social History Main Topics  . Smoking status: Current Everyday Smoker -- 1.5 packs/day for 30 years    Types: Cigarettes  . Smokeless tobacco: Never Used  . Alcohol Use: Yes     drinks 2 alcohol beverages per day  . Drug Use: No  . Sexually Active:    Other Topics Concern  . Not on file   Social History Narrative   Married, has one daughter, works Holiday representative, smokes 1/2 ppd currently, drunks 2 alcoholic beverages per day, and drinks 3 caffeine beverages per day.    Family History  Problem Relation Age of Onset  . Colon cancer      no family Hx of    Allergies as of 07/03/2011  . (No Known Allergies)    Current Outpatient  Prescriptions on File Prior to Visit  Medication Sig Dispense Refill  . albuterol (PROVENTIL HFA;VENTOLIN HFA) 108 (90 BASE) MCG/ACT inhaler Inhale 2 puffs into the lungs every 6 (six) hours as needed. For shortness of breath      . amitriptyline (ELAVIL) 50 MG tablet Take 50 mg by mouth at bedtime.        Marland Kitchen amLODipine (NORVASC) 10 MG tablet Take 10 mg by mouth daily.        Marland Kitchen aspirin 325 MG tablet Take 1 tablet (325 mg total) by mouth daily.  30 tablet  0  . metoprolol (TOPROL-XL) 100 MG 24 hr tablet Take 100 mg by mouth at bedtime.       . predniSONE (DELTASONE) 10 MG tablet Take 10 mg by mouth every other day.        . tiotropium (SPIRIVA) 18 MCG inhalation capsule Place 18 mcg into inhaler and inhale daily.        . traMADol (ULTRAM) 50 MG tablet Take 50 mg by mouth every 6 (six) hours as needed. For pain.         REVIEW OF SYSTEMS:  PHYSICAL EXAMINATION:   Vital signs are BP 145/94  Pulse 120  Resp 16  Ht 5\' 9"  (1.753 m)  Wt 161 lb (73.029 kg)  BMI 23.78 kg/m2  SpO2 100% General: The patient appears their stated age. HEENT:  No gross abnormalities Pulmonary:  Non labored breathing Abdomen: Soft and non-tender Musculoskeletal: There are no major deformities. Neurologic: No focal weakness or paresthesias are detected, Skin: Ulceration on the tip of the right third finger Psychiatric: The patient has normal affect. Cardiovascular: Radial and ulnar artery are not palpable   Diagnostic Studies None  Assessment: Bilateral ischemic hands Plan: My suspicion is that the patient's suffering from Buerger's disease. He does have ulceration of the right middle finger which has not healed. I'm concerned that this may lead to amputation because I'm not sure he has adequate blood supply to get this to heal. I am sending him oupfor his appointment at [facility]. I did give him a prescription for 50 Percocet. I placed him on a nitroglycerin patch and an attempt to enhance circulation  to his right finger and hopes to get this ulcer to heal. I did discuss with him that this is a hand threatening problem. I told him that he would need a hand surgeon moving forward for treatment of this problem. I have not scheduled him a return appointment.  Jorge Ny, M.D. [facility] [place] Office: [phone] Pager:  [phone]

## 2011-07-16 ENCOUNTER — Ambulatory Visit (INDEPENDENT_AMBULATORY_CARE_PROVIDER_SITE_OTHER): Payer: Managed Care, Other (non HMO)

## 2011-07-16 DIAGNOSIS — R000 Tachycardia, unspecified: Secondary | ICD-10-CM

## 2011-07-16 DIAGNOSIS — F17200 Nicotine dependence, unspecified, uncomplicated: Secondary | ICD-10-CM

## 2011-07-16 DIAGNOSIS — G459 Transient cerebral ischemic attack, unspecified: Secondary | ICD-10-CM

## 2011-07-27 ENCOUNTER — Other Ambulatory Visit: Payer: Self-pay

## 2011-07-27 DIAGNOSIS — N028 Recurrent and persistent hematuria with other morphologic changes: Secondary | ICD-10-CM

## 2011-07-27 MED ORDER — OXYCODONE-ACETAMINOPHEN 5-325 MG PO TABS: ORAL_TABLET | ORAL | Status: DC

## 2011-07-27 NOTE — Telephone Encounter (Signed)
Phone call from pt. Requests refill on Percocet for c/o continuous pain in index and middle finger right hand.  Describes pain as "burning continually".  Has appt. At [facility] 08/03/11 for further evaluation of symptoms.  States traveling through [place] and can pick-up prescription.  Discussed w/ Della Goo, PA.  Rec'd rx for Percocet.  Pt informed, and will pick up RX.

## 2011-07-30 ENCOUNTER — Ambulatory Visit (INDEPENDENT_AMBULATORY_CARE_PROVIDER_SITE_OTHER): Payer: Managed Care, Other (non HMO) | Admitting: Internal Medicine

## 2011-07-30 VITALS — BP 111/69 | HR 103 | Temp 98.4°F | Resp 16 | Ht 68.0 in | Wt 159.0 lb

## 2011-07-30 DIAGNOSIS — I739 Peripheral vascular disease, unspecified: Secondary | ICD-10-CM

## 2011-07-30 DIAGNOSIS — R0602 Shortness of breath: Secondary | ICD-10-CM

## 2011-07-30 DIAGNOSIS — R079 Chest pain, unspecified: Secondary | ICD-10-CM

## 2011-07-30 DIAGNOSIS — I10 Essential (primary) hypertension: Secondary | ICD-10-CM

## 2011-07-30 DIAGNOSIS — J449 Chronic obstructive pulmonary disease, unspecified: Secondary | ICD-10-CM | POA: Insufficient documentation

## 2011-07-30 DIAGNOSIS — G459 Transient cerebral ischemic attack, unspecified: Secondary | ICD-10-CM

## 2011-07-30 DIAGNOSIS — F17200 Nicotine dependence, unspecified, uncomplicated: Secondary | ICD-10-CM | POA: Insufficient documentation

## 2011-07-30 MED ORDER — TRAMADOL HCL 50 MG PO TABS: 50.0000 mg | ORAL_TABLET | Freq: Four times a day (QID) | ORAL | Status: DC | PRN

## 2011-07-30 MED ORDER — TRAMADOL HCL 50 MG PO TABS: 100.0000 mg | ORAL_TABLET | Freq: Four times a day (QID) | ORAL | Status: DC | PRN

## 2011-07-30 MED ORDER — TIOTROPIUM BROMIDE MONOHYDRATE 18 MCG IN CAPS: 18.0000 ug | ORAL_CAPSULE | Freq: Every day | RESPIRATORY_TRACT | Status: DC

## 2011-07-30 NOTE — Progress Notes (Signed)
EKG/Rythm strip performed/ Tyler [name]

## 2011-07-30 NOTE — Progress Notes (Signed)
  Subjective:    Patient ID: Tyler [name], male    DOB: 1950/08/07, 61 y.o.   MRN: [id number]  HPIMr. Sax presents today with a one-week history of anterior chest pain both at rest and with exertion. He has shortness of breath but often has this with his COPD. The chest pain started when he went from 1-2 Chantix pills per day. His hand symptoms which had been improving suddenly got worse as well. Of note on his 07/16/2011 office visit Dr. Perrin Maltese started him on 60 mg of prednisone daily for a month out of concern that his underlying problem might be giantcell arteritis  Referral to rheumatology and to [facility] vascular surgery are both pending Mr. Mardella Layman had a TIA recently but currently has no symptoms He has not had diaphoresis or nausea and vomiting. He has no palpitations He stopped his Chantix 3 days ago believing this to be the cause of his chest pain and increased hand pain as these are currently listed as side effects. His chest pain has all but disappeared though his hands continue to show signs of ischemia.    Review of Systemsthere is no fever or night sweats. There is no joint redness or swelling. He does not have a cough.     Objective:   Physical Exam HEENT is clear. Heart is regular and rapid at 90 beats per minute but without murmur click or rub or gallop. There are no carotid bruits. The lungs are clear The fingertips on the right hand are purple and there is an area of ischemia on the tip of the right third finger. There is no peripheral edema. There is no JVD.   EKG is unchanged from his last one.    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  Problem #5 chronic pain  secondary to underlying medical illnesses-he would prefer to have tramadol to use at work rather than the oxycodone which makes him a bit drowsy   We will followup with Dr. Kellie Simmering for rheumatology evaluation and with the hand surgeons at [facility] to consider treating his ulcerated fingertip

## 2011-08-19 ENCOUNTER — Ambulatory Visit (INDEPENDENT_AMBULATORY_CARE_PROVIDER_SITE_OTHER): Payer: Managed Care, Other (non HMO) | Admitting: Internal Medicine

## 2011-08-19 VITALS — BP 134/83 | HR 109 | Temp 97.7°F | Resp 16 | Ht 68.0 in | Wt 154.0 lb

## 2011-08-19 DIAGNOSIS — M79609 Pain in unspecified limb: Secondary | ICD-10-CM

## 2011-08-19 DIAGNOSIS — Z09 Encounter for follow-up examination after completed treatment for conditions other than malignant neoplasm: Secondary | ICD-10-CM

## 2011-08-19 DIAGNOSIS — H538 Other visual disturbances: Secondary | ICD-10-CM

## 2011-08-19 DIAGNOSIS — I731 Thromboangiitis obliterans [Buerger's disease]: Secondary | ICD-10-CM | POA: Insufficient documentation

## 2011-08-19 DIAGNOSIS — R739 Hyperglycemia, unspecified: Secondary | ICD-10-CM

## 2011-08-19 DIAGNOSIS — M79643 Pain in unspecified hand: Secondary | ICD-10-CM

## 2011-08-19 DIAGNOSIS — R7309 Other abnormal glucose: Secondary | ICD-10-CM

## 2011-08-19 LAB — GLUCOSE, POCT (MANUAL RESULT ENTRY): POC Glucose: 116

## 2011-08-19 NOTE — Progress Notes (Signed)
  Subjective:    Patient ID: Tyler [name], male    DOB: [birth year], 61 y.o.   MRN: [id number]  HPI Comes in today with increased pain in the hand and a worsening of the vascular ulcer at the tip of the right third finger He was discharged from the hospital -- throat 2 days ago following  Emergency admission for abdominal pain that was found to be secondary to abscesses in the small intestine or the colon. The surgeon commented that these were related to having been on prednisone for so long plus having a history of Crohn's disease. He has an ileostomy in place and has done well with the surgery but the changes in his medication during hospitalization with his hand pain returned with a vengeance. He has consulted with Dr. Nedra Hai at [facility] who referred him to another vascular surgeon at the hospital for second opinion. He is still in the process of having a rheumatology evaluation with Dr. Harriet Masson low. At the point of discharge she restarted prednisone as it was prescribed here in December He takes Percocet 2 tablets/10 mg every 6 hours and tramadol as needed and it is not quite controlling the pain Since the time of hospital admission he has noticed a great change in his vision is variable at times he can see well and at times he can't see the television. At one point during hospitalization his blood sugar was 400 and insulin was needed. Although his hemoglobin A1c has been as high as 6.1 he does not. A diagnosis of diabetes at this point and is not on any treatment. he is upset that his weight is starting to increase again because his appetite is adequate control on prednisone.   Review of SystemsHe has COPD with asthma but there are no complaints at this time He has hypertension on medication also no complaints He has not smoked for 3 weeks now.     Objective:   Physical Exam Vital signs are stable\He looks remarkably healthy Pupils are equal round reactive to light and  accommodation  The right hand has a 1 cm scab at the tip of the third finger. There is no blanching or cyanosis. The abdominal incision looks good and the ileostomy is draining well Neurological is intact except for sensory changes of the finger        Assessment & Plan:  Problem #1 pain hand secondary to peripheral vascular disease/Berger's disease with ulceration Problem #2 vision changes probably secondary to hyperglycemia from prednisone 2 hour postprandial glucose this morning is 116-I would hope that his vision changes and stabilized and he'll be back to his regular glasses Problem #3 status post abdominal surgery-stable  He will followup with Dr. Kellie Simmering to rule out PMR He will [facility]-the surgeon there feels he needs surgery for his peripheral vascular disease He will continue out of work status post abdominal surgery and we will wait for the notes to review

## 2011-09-04 ENCOUNTER — Inpatient Hospital Stay (HOSPITAL_COMMUNITY)
Admission: AD | Admit: 2011-09-04 | Discharge: 2011-09-07 | DRG: 176 | Disposition: A | Discharge status: Home / Self Care | Payer: Managed Care, Other (non HMO) | Source: Ambulatory Visit | Attending: Family Medicine | Admitting: Family Medicine

## 2011-09-04 ENCOUNTER — Encounter: Payer: Self-pay | Admitting: Internal Medicine

## 2011-09-04 ENCOUNTER — Ambulatory Visit: Payer: Managed Care, Other (non HMO)

## 2011-09-04 ENCOUNTER — Ambulatory Visit (INDEPENDENT_AMBULATORY_CARE_PROVIDER_SITE_OTHER): Payer: Managed Care, Other (non HMO) | Admitting: Internal Medicine

## 2011-09-04 ENCOUNTER — Encounter (HOSPITAL_COMMUNITY): Payer: Self-pay

## 2011-09-04 VITALS — BP 131/91 | HR 140 | Resp 24 | Ht 68.0 in | Wt 150.8 lb

## 2011-09-04 DIAGNOSIS — IMO0002 Reserved for concepts with insufficient information to code with codable children: Secondary | ICD-10-CM

## 2011-09-04 DIAGNOSIS — I2699 Other pulmonary embolism without acute cor pulmonale: Secondary | ICD-10-CM

## 2011-09-04 DIAGNOSIS — L509 Urticaria, unspecified: Secondary | ICD-10-CM

## 2011-09-04 DIAGNOSIS — R000 Tachycardia, unspecified: Secondary | ICD-10-CM

## 2011-09-04 DIAGNOSIS — M79643 Pain in unspecified hand: Secondary | ICD-10-CM

## 2011-09-04 DIAGNOSIS — I731 Thromboangiitis obliterans [Buerger's disease]: Secondary | ICD-10-CM

## 2011-09-04 DIAGNOSIS — N02B9 Other recurrent and persistent immunoglobulin A nephropathy: Secondary | ICD-10-CM

## 2011-09-04 DIAGNOSIS — M129 Arthropathy, unspecified: Secondary | ICD-10-CM | POA: Diagnosis present

## 2011-09-04 DIAGNOSIS — F411 Generalized anxiety disorder: Secondary | ICD-10-CM | POA: Diagnosis present

## 2011-09-04 DIAGNOSIS — I739 Peripheral vascular disease, unspecified: Secondary | ICD-10-CM

## 2011-09-04 DIAGNOSIS — R0602 Shortness of breath: Secondary | ICD-10-CM

## 2011-09-04 DIAGNOSIS — I4891 Unspecified atrial fibrillation: Secondary | ICD-10-CM

## 2011-09-04 DIAGNOSIS — Z9049 Acquired absence of other specified parts of digestive tract: Secondary | ICD-10-CM

## 2011-09-04 DIAGNOSIS — K5090 Crohn's disease, unspecified, without complications: Secondary | ICD-10-CM

## 2011-09-04 DIAGNOSIS — I10 Essential (primary) hypertension: Secondary | ICD-10-CM

## 2011-09-04 DIAGNOSIS — R209 Unspecified disturbances of skin sensation: Secondary | ICD-10-CM

## 2011-09-04 DIAGNOSIS — J449 Chronic obstructive pulmonary disease, unspecified: Secondary | ICD-10-CM | POA: Diagnosis present

## 2011-09-04 DIAGNOSIS — E785 Hyperlipidemia, unspecified: Secondary | ICD-10-CM | POA: Diagnosis present

## 2011-09-04 DIAGNOSIS — N028 Recurrent and persistent hematuria with other morphologic changes: Secondary | ICD-10-CM

## 2011-09-04 DIAGNOSIS — Z933 Colostomy status: Secondary | ICD-10-CM

## 2011-09-04 DIAGNOSIS — Z7982 Long term (current) use of aspirin: Secondary | ICD-10-CM

## 2011-09-04 DIAGNOSIS — J4489 Other specified chronic obstructive pulmonary disease: Secondary | ICD-10-CM | POA: Diagnosis present

## 2011-09-04 DIAGNOSIS — K922 Gastrointestinal hemorrhage, unspecified: Secondary | ICD-10-CM | POA: Diagnosis present

## 2011-09-04 DIAGNOSIS — D509 Iron deficiency anemia, unspecified: Secondary | ICD-10-CM | POA: Diagnosis present

## 2011-09-04 LAB — POCT CBC
Granulocyte percent: 70.5 %G (ref 37–80)
HCT, POC: 37.5 % — AB (ref 43.5–53.7)
HCT, POC: 38.6 % — AB (ref 43.5–53.7)
Hemoglobin: 12.2 g/dL — AB (ref 14.1–18.1)
Hemoglobin: 12.2 g/dL — AB (ref 14.1–18.1)
Lymph, poc: 3 (ref 0.6–3.4)
MCHC: 31.6 g/dL — AB (ref 31.8–35.4)
MCV: 84.8 fL (ref 80–97)
POC Granulocyte: 6.4 (ref 2–6.9)
POC Granulocyte: 9.9 — AB (ref 2–6.9)
POC LYMPH PERCENT: 21.2 %L (ref 10–50)
RDW, POC: 18.9 %
WBC: 14 10*3/uL — AB (ref 4.6–10.2)

## 2011-09-04 LAB — CARDIAC PANEL(CRET KIN+CKTOT+MB+TROPI)
Relative Index: INVALID (ref 0.0–2.5)
Total CK: 34 U/L (ref 7–232)

## 2011-09-04 LAB — D-DIMER, QUANTITATIVE: D-Dimer, Quant: 3.47 ug/mL-FEU — ABNORMAL HIGH (ref 0.00–0.48)

## 2011-09-04 LAB — COMPREHENSIVE METABOLIC PANEL
Alkaline Phosphatase: 85 U/L (ref 39–117)
BUN: 15 mg/dL (ref 6–23)
GFR calc Af Amer: >90 mL/min (ref >90)
GFR calc non Af Amer: 88 mL/min — ABNORMAL LOW (ref >90)
Glucose, Bld: 144 mg/dL — ABNORMAL HIGH (ref 70–99)
Potassium: 3.8 mEq/L (ref 3.5–5.1)
Total Protein: 6 g/dL (ref 6.0–8.3)

## 2011-09-04 LAB — CBC
HCT: 33.2 % — ABNORMAL LOW (ref 39.0–52.0)
Platelets: 121 10*3/uL — ABNORMAL LOW (ref 150–400)
RDW: 18 % — ABNORMAL HIGH (ref 11.5–15.5)
WBC: 9.3 10*3/uL (ref 4.0–10.5)

## 2011-09-04 LAB — PROTIME-INR
INR: 1.02 (ref 0.00–1.49)
Prothrombin Time: 13.6 seconds (ref 11.6–15.2)

## 2011-09-04 LAB — IFOBT (OCCULT BLOOD): IFOBT: POSITIVE

## 2011-09-04 MED ORDER — SODIUM CHLORIDE 0.9 % IJ SOLN
3.0000 mL | Freq: Two times a day (BID) | INTRAMUSCULAR | Status: DC
  Administered 2011-09-04 – 2011-09-06 (×3): 3 mL via INTRAVENOUS

## 2011-09-04 MED ORDER — SODIUM CHLORIDE 0.9 % IJ SOLN: 3.0000 mL | INTRAMUSCULAR | Status: DC | PRN

## 2011-09-04 MED ORDER — PREDNISONE 20 MG PO TABS
40.0000 mg | ORAL_TABLET | Freq: Every day | ORAL | Status: DC
  Administered 2011-09-05 – 2011-09-07 (×3): 40 mg via ORAL
  Filled 2011-09-04 (×4): qty 2

## 2011-09-04 MED ORDER — HEPARIN (PORCINE) IN NACL 100-0.45 UNIT/ML-% IJ SOLN
1100.0000 [IU]/h | INTRAMUSCULAR | Status: DC
  Administered 2011-09-04: 1100 [IU]/h via INTRAVENOUS
  Filled 2011-09-04 (×3): qty 250

## 2011-09-04 MED ORDER — TRAMADOL HCL 50 MG PO TABS
100.0000 mg | ORAL_TABLET | Freq: Four times a day (QID) | ORAL | Status: DC | PRN
  Filled 2011-09-04 (×2): qty 2

## 2011-09-04 MED ORDER — MUPIROCIN 2 % EX OINT
1.0000 "application " | TOPICAL_OINTMENT | Freq: Every day | CUTANEOUS | Status: DC
  Administered 2011-09-04 – 2011-09-07 (×4): 1 via TOPICAL
  Filled 2011-09-04: qty 22

## 2011-09-04 MED ORDER — HEPARIN BOLUS VIA INFUSION
4500.0000 [IU] | Freq: Once | INTRAVENOUS | Status: AC
  Administered 2011-09-04: 4500 [IU] via INTRAVENOUS
  Filled 2011-09-04: qty 4500

## 2011-09-04 MED ORDER — NITROGLYCERIN 0.3 MG/HR TD PT24
0.3000 mg | MEDICATED_PATCH | Freq: Every day | TRANSDERMAL | Status: DC
  Administered 2011-09-05 – 2011-09-07 (×3): 0.3 mg via TRANSDERMAL
  Filled 2011-09-04 (×3): qty 1

## 2011-09-04 MED ORDER — OXYCODONE-ACETAMINOPHEN 5-325 MG PO TABS
1.0000 | ORAL_TABLET | Freq: Four times a day (QID) | ORAL | Status: DC | PRN
  Administered 2011-09-05 – 2011-09-06 (×5): 1 via ORAL
  Filled 2011-09-04 (×5): qty 1

## 2011-09-04 MED ORDER — SODIUM CHLORIDE 0.9 % IJ SOLN
3.0000 mL | Freq: Two times a day (BID) | INTRAMUSCULAR | Status: DC
  Administered 2011-09-05: 3 mL via INTRAVENOUS

## 2011-09-04 MED ORDER — SODIUM CHLORIDE 0.9 % IV SOLN: 250.0000 mL | INTRAVENOUS | Status: DC | PRN

## 2011-09-04 MED ORDER — ALBUTEROL SULFATE HFA 108 (90 BASE) MCG/ACT IN AERS
2.0000 | INHALATION_SPRAY | Freq: Four times a day (QID) | RESPIRATORY_TRACT | Status: DC | PRN
  Filled 2011-09-04: qty 6.7

## 2011-09-04 MED ORDER — AMLODIPINE BESYLATE 10 MG PO TABS
10.0000 mg | ORAL_TABLET | Freq: Every day | ORAL | Status: DC
  Administered 2011-09-04 – 2011-09-07 (×4): 10 mg via ORAL
  Filled 2011-09-04 (×4): qty 1

## 2011-09-04 MED ORDER — TIOTROPIUM BROMIDE MONOHYDRATE 18 MCG IN CAPS
18.0000 ug | ORAL_CAPSULE | Freq: Every day | RESPIRATORY_TRACT | Status: DC
  Administered 2011-09-05 – 2011-09-07 (×3): 18 ug via RESPIRATORY_TRACT
  Filled 2011-09-04 (×2): qty 5

## 2011-09-04 MED ORDER — AMITRIPTYLINE HCL 50 MG PO TABS
50.0000 mg | ORAL_TABLET | Freq: Every day | ORAL | Status: DC
  Administered 2011-09-04 – 2011-09-06 (×3): 50 mg via ORAL
  Filled 2011-09-04 (×4): qty 1

## 2011-09-04 NOTE — Progress Notes (Signed)
  Subjective:    Patient ID: Tyler [name], male    DOB: July 30, 1950, 61 y.o.   MRN: [id number]  HPI Had colectomy at [facility] Feb. 17 for active crohns. Dr. Kellie Simmering rheum. DCed lisinopril And losartan. Has lost 12 lbs and is on amlodipine 10mg . Gut has healed, but Bergers disease is active in 3 fingers. Is wheening off steroids now. Has steroid facies. Wants to return to work   Review of Systems Is SOB and palpitations    Objective:   Physical Exam R hand has 3 fingers with ulcers. Abdomen is tender, soft. Has large scar healing with bag, stool is brown Is very pale and tachycardia 140. Stat EKG, hemosure, CBC  Results for orders placed in visit on 09/04/11  IFOBT (OCCULT BLOOD)      Component Value Range   IFOBT Positive    POCT CBC      Component Value Range   WBC 14.0 (*) 4.6 - 10.2 (K/uL)   Lymph, poc 3.0  0.6 - 3.4    POC LYMPH PERCENT 21.2  10 - 50 (%L)   MID (cbc) 1.2 (*) 0 - 0.9    POC MID % 8.4  0 - 12 (%M)   POC Granulocyte 9.9 (*) 2 - 6.9    Granulocyte percent 70.4  37 - 80 (%G)   RBC 4.55 (*) 4.69 - 6.13 (M/uL)   Hemoglobin 12.2 (*) 14.1 - 18.1 (g/dL)   HCT, POC 40.9 (*) 81.1 - 53.7 (%)   MCV 84.8  80 - 97 (fL)   MCH, POC 26.8 (*) 27 - 31.2 (pg)   MCHC 31.6 (*) 31.8 - 35.4 (g/dL)   RDW, POC 91.4     Platelet Count, POC 181  142 - 424 (K/uL)   MPV 7.4  0 - 99.8 (fL)   [facility] reading (PRIMARY) by  Dr. Perrin Maltese CXR NAD     Assessment & Plan:  Mild anemia  Sinus tachycardia 140 min clammy, sob R/o PE CT chest, angiogram  RTC tomorrow am, if CT neg then refer to cardiology  CT scan revealed pulmonary embolus Called and admitted to [facility] Discussed case and all findings and hx with DR. Losq MD D dimer was elevated.

## 2011-09-04 NOTE — Patient Instructions (Signed)
Tachycardia, Nonspecific  In adults, the heart normally beats between 60 and 100 times a minute. A heart rate over 100 is called tachycardia. When your heart beats too fast, it may not be able to pump enough blood to the rest of the body.  CAUSES    Exercise or exertion.   Fever.   Pain or injury.   Infection.   Loss of fluid (dehydration).   Overactive thyroid.   Lack of red blood cells (anemia).   Anxiety.   Alcohol.   Heart arrhythmia.   Caffeine.   Tobacco products.   Diet pills.   Street drugs.   Heart disease.  SYMPTOMS   Palpitations (rapid or irregular heartbeat).   Dizziness.   Tiredness (fatigue).   Shortness of breath.  DIAGNOSIS   After an exam and taking a history, your caregiver may order:   Blood tests.   Electrocardiogram (EKG).   Heart monitor.  TREATMENT   Treatment will depend on the cause and potential for harm. It may include:   Intravenous (IV) replacement of fluids or blood.   Antidote or reversal medicines.   Changes in your present medicines.   Lifestyle changes.  HOME CARE INSTRUCTIONS    Get rest.   Drink enough water and fluids to keep your urine clear or pale yellow.   Avoid:   Caffeine.   Nicotine.   Alcohol.   Stress.   Chocolate.   Stimulants.   Only take medicine as directed by your caregiver.  SEEK IMMEDIATE MEDICAL CARE IF:    You have pain in your chest, upper arms, jaw, or neck.   You become weak, dizzy, or feel faint.   You have palpitations that will not go away.   You throw up (vomit), have diarrhea, or pass blood.   You look pale and your skin is cool and wet.  MAKE SURE YOU:    Understand these instructions.   Will watch your condition.   Will get help right away if you are not doing well or get worse.  Document Released: 07/13/2004 Document Revised: 05/25/2011 Document Reviewed: 05/16/2011  Exit[employer] Patient Information 2012 [employer].

## 2011-09-04 NOTE — H&P (Signed)
Tyler [name] is an 61 y.o. male.   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 with a diagnosis of Pulmonary Embolism. He presented to the clinic with a 4 day history of increased shortness of breath with minimal activity, increased heart rate (measured in the 120's on his blood pressure machine at home) and dizziness with activity. He denies any chest pain, any pleuritic pain, any nausea or vomiting. Denies any swelling in his lower extremities. He has been more sedentary than he usually is since his surgery in mid February. He denies any fever although reports some occasional chills.  At [facility], he was found to be tachycardic in the 140's and mildly tachypneic. A d-dimer was elevated at 3.47 and he was sent to get CT angio, which was positive for PE, per Dr. Ernestene Mention report. He was also found to be hemoccult positive. He was admitted for monitoring and treatment of PE.  Past Medical History  Diagnosis Date  . Hyperlipidemia   . Crohn disease   . Anemia   . COPD (chronic obstructive pulmonary disease)   . Hypertension   . Arthritis   . Anxiety   . 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 Kellie Simmering with rheumatology - TIA  Past Surgical History  Procedure Date  . Crohn's surgery 1979  - 08/06/11: colectomy for perforation and abscess secondary to Crohn's disease. Has ostomy bag in place.   Family History  Problem Relation Age of Onset  . Colon cancer      no family Hx of   Social History:  reports that he has been smoking Cigarettes.  He has a 45 pack-year smoking history. He has never used smokeless tobacco.  Alcohol: reports drinking 7-8 beers per night.  He reports that he does not use illicit drugs.  Allergies:  Allergies  Allergen Reactions  . Metoprolol     Circulation problems     Medications Prior to Admission  Medication Sig Dispense Refill  . albuterol (PROVENTIL HFA;VENTOLIN HFA) 108 (90 BASE) MCG/ACT inhaler Inhale 2 puffs into the lungs every 6 (six) hours as needed. For shortness of breath      . amitriptyline (ELAVIL) 50 MG tablet Take 50 mg by mouth at bedtime.        Marland Kitchen amLODipine (NORVASC) 10 MG tablet Take 10 mg by mouth daily.        Marland Kitchen amoxicillin-clavulanate (AUGMENTIN) 875-125 MG per tablet Take 1 tablet by mouth 2 (two) times daily.      Marland Kitchen aspirin 325 MG tablet Take 1 tablet (325 mg total) by mouth daily.  30 tablet  0  . mupirocin ointment (BACTROBAN) 2 % Apply 1 application topically daily.      . nitroGLYCERIN (NITRODUR - DOSED IN MG/24 HR) 0.3 mg/hr Place 1 patch onto the skin daily.      Marland Kitchen oxyCODONE-acetaminophen (ROXICET) 5-325 MG per tablet Take 1-2 tablets q 6 hr prn pain.  30 tablet  0  . predniSONE (DELTASONE) 10 MG tablet Take 40 mg by mouth daily.       Marland Kitchen tiotropium (SPIRIVA) 18 MCG inhalation capsule Place 1 capsule (18 mcg total) into inhaler and inhale daily.  30 capsule  5  . traMADol (ULTRAM) 50 MG tablet Take 2 tablets (100 mg total) by mouth every 6 (six) hours as needed. For  pain.  120 tablet  2    Results for orders placed in visit on 09/04/11 (from the past 48 hour(s))  IFOBT (OCCULT BLOOD)     Status: Normal   Collection Time   09/04/11 10:04 AM      Component Value Range Comment   IFOBT Positive     POCT CBC     Status: Abnormal   Collection Time   09/04/11 10:11 AM      Component Value Range Comment   WBC 14.0 (*) 4.6 - 10.2 (K/uL)    Lymph, poc 3.0  0.6 - 3.4     POC LYMPH PERCENT 21.2  10 - 50 (%L)    MID (cbc) 1.2 (*) 0 - 0.9     POC MID % 8.4  0 - 12 (%M)    POC Granulocyte 9.9 (*) 2 - 6.9     Granulocyte percent 70.4  37 - 80 (%G)    RBC 4.55 (*) 4.69 - 6.13 (M/uL)    Hemoglobin 12.2 (*) 14.1 - 18.1 (g/dL)    HCT, POC 54.0 (*) 98.1 - 53.7 (%)    MCV 84.8  80 - 97 (fL)    MCH, POC 26.8 (*) 27 - 31.2 (pg)     MCHC 31.6 (*) 31.8 - 35.4 (g/dL)    RDW, POC 19.1      Platelet Count, POC 181  142 - 424 (K/uL)    MPV 7.4  0 - 99.8 (fL)   D-DIMER, QUANTITATIVE     Status: Abnormal   Collection Time   09/04/11 11:17 AM      Component Value Range Comment   D-Dimer, Quant 3.47 (*) 0.00 - 0.48 (ug/mL-FEU)    Dg Chest 2 View  09/04/2011  [facility] *RADIOLOGY REPORT*  Clinical Data: Shortness of breath  CHEST - 2 VIEW  Comparison: None  Findings: Normal heart size, mediastinal contours, and pulmonary vascularity. Lungs clear. No pleural effusion or pneumothorax. Bones appear mildly demineralized.  IMPRESSION: No acute abnormalities.  Original Report Authenticated By: Lollie Marrow, M.D.    ROS No abdominal pain, no nausea no vomiting. Does notice gross blood in stool from ostomy bag, which he was told is normal. Pain in both hands, worst in  Right than left. Describes fingers turning white on occasion. Has associated cramping of his hands. Has a sore on his right middle finger that has not healed in 10 weeks.  Filed Vitals:   09/04/11 2111  BP: 143/91  Pulse: 102  Temp: 97.7 F (36.5 C)  TempSrc: Oral  Resp: 20  Height: 5\' 8"  (1.727 m)  Weight: 148 lb 9.4 oz (67.4 kg)  SpO2: 97%    Physical Exam  Physical Examination: General appearance - alert, well appearing, and in no distress Chest - breathing comfortably on room air, normal work of breathing, no wheezing or crackles appreciated Heart - S1S2, rrr, no murmurs  Abdomen - soft, non tender, midline incision scar healing well, no erythema, ostomy bag with greenish stool with minimal blood.  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 1. Pulmonary Embolism: most likely secondary to immobilization s/p surgery. CT angio was done at outside imaging facility and did not have report in the disk provided to  the patient. However, per patient's PCP, Dr. Perrin Maltese, he has a confirmed PE on CTA.  Given his positive heme occult, will start  him on anticoagulation that can be easily reversed in the event of an acute GI bleed. Will start him on heparin tonight with CBC tonight and tomorrow morning. Last hemoglobin was stable at 12.2.  Will then transition him to oral agent if he is able to tolerate anticoagulation without significant bleeding - INR, PT, PTT, CBC in am 2. Hypertension: stable on admission at 143/91. Will continue home medication:norvasc 10mg  daily 3. Hand pain: secondary to vasculitic process: most likely Buerger's. Seen by Dr. Kellie Simmering with rheumatology and [facility] hand specialists. Continue prednisone 40mg  daily and tramadol 100mg  q6/prn. Continue home nitro patch.  4. Crohn's disease: s/p recent colectomy. Stable. On prednisone 40mg  daily. No active flare. Will monitor ostomy output and any evidence of increased blood in stool. Wound care to assist with ostomy change 5. Alcohol use: with history of 6-7 beers/night, will start patient on CIWA protocol to monitor for any signs of withdrawal 6. Fen/Gi: regular diet 7. PPx: heparin therapeutic dose for PE 8. Dispo: admit to tele - pending transition to oral anticoagulation  Marena Chancy PGY1 [facility] Teaching Service [phone]  3rd Year Addendum: FPTS  History of present illness: To the recent total colectomy secondary to Crohn's disease who has had 3-4 days of worsening dyspnea, tachypnea, lightheadedness. He presented to family urgent and medical care today for these continued symptoms. He had positive d-dimer as well as fecal occult blood test there clinic today. Due to symptoms he was sent for a CT angiogram of his chest which did indeed reveal pulmonary embolism reportedly. He was therefore called by his primary care physician and told to come to [facility] for admission. He denies any fevers or chills. Denies any chest  pain. No history of DVT or PE in the past.  Please see above R1 note for full Family, Medical, Surgical, Social History.  Gen:  Alert, cooperative patient who appears stated age in no acute distress.  Vital signs reviewed. HEENT:  [place]/AT.  EOMI, PERRL.  MMM, tonsils non-erythematous, non-edematous.  External ears WNL, Bilateral TM's normal without retraction, redness or bulging.  Neck: No masses or thyromegaly or limitation in range of motion.  No cervical lymphadenopathy. Pulm:  Clear to auscultation bilaterally with good air movement.  No wheezes or rales noted.   Cardiac:  Regular rate and rhythm without murmur auscultated.  Good S1/S2. Abd:  Soft/nondistended.  Right ileostomy bag noted. Stool and very small amount of blood present in ileostomy bag. Nontender Ext:  No clubbing/cyanosis/erythema.  No edema noted bilateral lower extremities.   Skin: Senile purpura noted bilateral upper extremities. He also has right ulcer third digit right hand. This is currently covered and protected. Neuro:  Grossly normal, no gait abnormalities Psych:  Not depressed or anxious appearing.  Conversant and engaged  Assessment and plan 61 year old male admitted for reported PE based on CT angiogram.  Problem #1: Pulmonary embolism: Patient did bring this again today with [facility]. However I was unable to pull up her report for this. Palpation provider did document in his notes that he was called by the imaging center for positive pulmonary embolism. Only concern is that we do have access to the report in this patient who is currently bleeding although this appears to be a mild amount of bleeding. Plan start him on heparin overnight. We'll watch his hemoglobin overnight. We're obtaining a hemoglobin today for baseline. We will recheck this in the morning as well as PT INR  studies. The plan is to switch him to long-term oral anticoagulation as long as we know he can tolerate  anticoagulation. Discussed risks and benefits with the patient and we elected to proceed with anticoagulation as the risk of a pulmonary embolism is greater than bleeding. Obtain pulse ox.  #2. Hypertension: Plan to continue him on his home hypertensive medications. However if he shows any signs of hypotension we'll stop this.  #3 Crohn's disease: He is followed by Dr. Kellie Simmering rheumatology. He is being tapered on a slow prednisone taper. We're currently to continue his prednisone at 40 mg which is what he takes an outpatient basis.  #4. Berger's disease: As noted above followed by rheumatology. He is on 40 mg daily of prednisone. In the distant past. He also has moderate to severe pain in his fingers secondary to his vasculitis. Plan to continue tramadol as when necessary oxycodone for pain relief.  #5.FEN/GI: adult diet as tolerated  6.  PPX:  Heparin treatment dose  7.  DIspo:  Pending further workup and ability to transfer to oral anticoagulation  [facility] 09/04/2011 9:23 PM

## 2011-09-04 NOTE — Progress Notes (Signed)
ANTICOAGULATION CONSULT NOTE - Initial Consult  Pharmacy Consult for Heparin Indication: pulmonary embolus  Allergies  Allergen Reactions  . Metoprolol     Circulation problems    Patient Measurements: Wt = 68.4 kg  Vital Signs: BP: 131/91 mmHg (03/18 0856) Pulse Rate: 140  (03/18 0856)  Labs:  Basename 09/04/11 1011  HGB 12.2*  HCT 38.6*  PLT --  APTT --  LABPROT --  INR --  HEPARINUNFRC --  CREATININE --  CKTOTAL --  CKMB --  TROPONINI --   The CrCl is unknown because both a height and weight (above a minimum accepted value) are required for this calculation.  Medical History: Past Medical History  Diagnosis Date  . Hyperlipidemia   . Crohn disease   . Anemia   . COPD (chronic obstructive pulmonary disease)   . Hypertension   . Kidney stones   . Arthritis   . Anxiety   . Joint pain     Medications:  Prescriptions prior to admission  Medication Sig Dispense Refill  . albuterol (PROVENTIL HFA;VENTOLIN HFA) 108 (90 BASE) MCG/ACT inhaler Inhale 2 puffs into the lungs every 6 (six) hours as needed. For shortness of breath      . amitriptyline (ELAVIL) 50 MG tablet Take 50 mg by mouth at bedtime.        Marland Kitchen amLODipine (NORVASC) 10 MG tablet Take 10 mg by mouth daily.        Marland Kitchen amoxicillin-clavulanate (AUGMENTIN) 875-125 MG per tablet Take 1 tablet by mouth 2 (two) times daily.      Marland Kitchen aspirin 325 MG tablet Take 1 tablet (325 mg total) by mouth daily.  30 tablet  0  . mupirocin ointment (BACTROBAN) 2 % Apply 1 application topically daily.      . nitroGLYCERIN (NITRODUR - DOSED IN MG/24 HR) 0.3 mg/hr Place 1 patch onto the skin daily.      Marland Kitchen oxyCODONE-acetaminophen (ROXICET) 5-325 MG per tablet Take 1-2 tablets q 6 hr prn pain.  30 tablet  0  . predniSONE (DELTASONE) 10 MG tablet Take 40 mg by mouth daily.       Marland Kitchen tiotropium (SPIRIVA) 18 MCG inhalation capsule Place 1 capsule (18 mcg total) into inhaler and inhale daily.  30 capsule  5  . traMADol (ULTRAM) 50 MG  tablet Take 2 tablets (100 mg total) by mouth every 6 (six) hours as needed. For pain.  120 tablet  2    Assessment: 61 year old s/p colectomy February 17, now admitted with PE  Goal of Therapy:  Heparin level 0.3-0.7 units/ml   Plan:  1) Heparin 4500 units IV bolus x 1 2) Heparin drip at 1100 units / hr 3) Heparin level, CBC 6 hours after heparin starts 4) Daily heparin level, CBC  Thank you.  Elwin Sleight 09/04/2011,8:33 PM

## 2011-09-05 ENCOUNTER — Other Ambulatory Visit: Payer: Self-pay

## 2011-09-05 LAB — BASIC METABOLIC PANEL
BUN: 16 mg/dL (ref 6–23)
BUN: 15 mg/dL (ref 6–23)
CO2: 25 mEq/L (ref 19–32)
Chloride: 95 mEq/L — ABNORMAL LOW (ref 96–112)
Chloride: 96 mEq/L (ref 96–112)
Glucose, Bld: 142 mg/dL — ABNORMAL HIGH (ref 70–99)
Potassium: 3.6 mEq/L (ref 3.5–5.3)
Potassium: 3.9 mEq/L (ref 3.5–5.1)
Sodium: 136 mEq/L (ref 135–145)

## 2011-09-05 LAB — CBC
HCT: 32.5 % — ABNORMAL LOW (ref 39.0–52.0)
Hemoglobin: 10.7 g/dL — ABNORMAL LOW (ref 13.0–17.0)
RBC: 3.83 MIL/uL — ABNORMAL LOW (ref 4.22–5.81)
WBC: 8.1 10*3/uL (ref 4.0–10.5)

## 2011-09-05 LAB — CARDIAC PANEL(CRET KIN+CKTOT+MB+TROPI)
CK, MB: 2.6 ng/mL (ref 0.3–4.0)
Relative Index: INVALID (ref 0.0–2.5)
Total CK: 28 U/L (ref 7–232)
Troponin I: <0.3 ng/mL (ref <0.30)

## 2011-09-05 LAB — OCCULT BLOOD X 1 CARD TO LAB, STOOL: Fecal Occult Bld: POSITIVE

## 2011-09-05 MED ORDER — OXYCODONE HCL 5 MG PO TABS
5.0000 mg | ORAL_TABLET | Freq: Once | ORAL | Status: AC | PRN
  Administered 2011-09-05: 5 mg via ORAL
  Filled 2011-09-05: qty 1

## 2011-09-05 MED ORDER — RIVAROXABAN 15 MG PO TABS: 15.0000 mg | ORAL_TABLET | Freq: Two times a day (BID) | ORAL | Status: DC

## 2011-09-05 MED ORDER — RIVAROXABAN 15 MG PO TABS
15.0000 mg | ORAL_TABLET | Freq: Two times a day (BID) | ORAL | Status: DC
  Administered 2011-09-05 – 2011-09-07 (×5): 15 mg via ORAL
  Filled 2011-09-05 (×7): qty 1

## 2011-09-05 NOTE — Progress Notes (Signed)
Daily Progress Note Family Medicine Teaching Service PGY-1   Patient name: Tyler [name]  Medical record [id number] Date of birth:[birth year] Age: 61 y.o. Gender: male  LOS: 1 day   Subjective: NO SOB, no palpitations. Feeling better. No events overnight.  Objective: Vitals: Patient Vitals for the past 24 hrs:  BP Temp Temp src Pulse Resp SpO2  09/05/11 0445 155/84 mmHg 97.2 F (36.2 C) Oral 110  20  100 %    Physical Exam: Gen:  NAD HEENT: Moist mucous membranes CV: Regular rate and rhythm, no murmurs rubs or gallops PULM: Clear to auscultation bilaterally. No wheezes/rales/rhonchi ABD: Soft, non tender, non distended, normal bowel sounds. Colostomy pouch in place with normal coloration fecal material. EXT: No edema. Pt has 0.5 cm ulcer in 3rd right finger. Neuro: Alert and oriented x3. No focalization  Labs and imaging:  CBC  Lab 09/05/11 0347 09/04/11 2051 09/04/11 1011  WBC 8.1 9.3 14.0*  HGB 10.7* 10.9* 12.2*  HCT 32.5* 33.2* 38.6*  PLT 114* 121* --   BMET  Lab 09/05/11 0347 09/04/11 2051 09/04/11 1222  NA 136 134* 136  K 3.9 3.8 3.6  CL 96 94* 95*  CO2 25 25 25   BUN 15 15 16   CREATININE 0.78 0.99 1.16  LABGLOM -- -- --  GLUCOSE 142* -- --  CALCIUM 9.2 9.2 9.1     Medications: Scheduled Meds:    . amitriptyline  50 mg Oral QHS  . amLODipine  10 mg Oral Daily  . heparin  4,500 Units Intravenous Once  . mupirocin ointment  1 application Topical Daily  . nitroGLYCERIN  0.3 mg Transdermal Daily  . predniSONE  40 mg Oral QAC breakfast  . rivaroxaban  15 mg Oral BID  . sodium chloride  3 mL Intravenous Q12H  . sodium chloride  3 mL Intravenous Q12H  . tiotropium  18 mcg Inhalation Daily   Continuous Infusions:   . heparin 1,100 Units/hr (09/04/11 2249)   PRN Meds:.sodium chloride, albuterol, oxyCODONE-acetaminophen, sodium chloride, traMADol Assessment and Plan: 61 yo male with h/o crohn's (s/p recent colectomy), possible Buerger's  vasculitis, COPD, hypertension who presents with reported PE.  1. Pulmonary Embolism. Per CT angiogram. Was a concern the possibility of GI  Bleeding, pt with Hb stable.  Benefit of anticoagulant therapy outweigh risks at this point. - stop Heparin and start Xarellto 15 mg BID for 3 weeks follow by 20 mg daily to complete 3 months therapy.  2. Hypertension:  - Continue home medication: Norvasc 10mg  daily   3. Hand pain: secondary to vasculitic process: most likely Buerger's. Seen by Dr. Kellie Simmering with rheumatology and [facility] hand specialists. Continue prednisone 40mg  daily and tramadol 100mg  q6/prn. Continue home nitro patch.   4. Crohn's disease: s/p recent colectomy. Stable. On prednisone 40mg  daily. No active flare.  -Will monitor ostomy output and any evidence of increased blood in stool.  -Wound care to assist with ostomy change.  5. Alcohol use: with history of 6-7 beers/night. No use of CIWA interventions -Continue protocol.   Fen/Gi: Regular diet  Dispo: Possible D/c home today if no signs of bleeding present.  D. [place] Rolene Arbour, MD PGY1, [facility] Medicine Teaching Service Pager [phone] 09/05/2011

## 2011-09-05 NOTE — Progress Notes (Signed)
Pt ambulated in halls SpO2 97%. Tolerated well.

## 2011-09-05 NOTE — Discharge Instructions (Signed)
Pulmonary Embolus A pulmonary (lung) embolus (PE) is a blood clot that has traveled from another place in the body to the lung. Most clots come from deep veins in the legs or pelvis. PE is a dangerous and potentially life-threatening condition that can be treated if identified. CAUSES Blood clots form in a vein for different reasons. Usually several things cause blood clots. They include:  The flow of blood slows down.   The inside of the vein is damaged in some way.   The person has a condition that makes the blood clot more easily. These conditions may include:   Older age (especially over 41 years old).   Having a history of blood clots.   Having major or lengthy surgery. Hip surgery is particularly high-risk.   Breaking a hip or leg.   Sitting or lying still for a long time.   Cancer or cancer treatment.   Having a long, thin tube (catheter) placed inside a vein during a medical procedure.   Being overweight (obese).   Pregnancy and childbirth.   Medicines with estrogen.   Smoking.   Other circulation or heart problems.  SYMPTOMS  The symptoms of a PE usually start suddenly and include:  Shortness of breath.   Coughing.   Coughing up blood or blood-tinged mucus (phlegm).   Chest pain. Pain is often worse with deep breaths.   Rapid heartbeat.  DIAGNOSIS  If a PE is suspected, your caregiver will take a medical history and carry out a physical exam. Your caregiver will check for the risk factors listed above. Tests that also may be required include:  Blood tests, including studies of the clotting properties of your blood.   Imaging tests. Ultrasound, CT, MRI, and other tests can all be used to see if you have clots in your legs or lungs. If you have a clot in your legs and have breathing or chest problems, your caregiver may conclude that you have a clot in your lungs. Further lung tests may not be needed.   An EKG can look for heart strain from blood clots in  the lungs.  PREVENTION   Exercise the legs regularly. Take a brisk 30 minute walk every day.   Maintain a weight that is appropriate for your height.   Avoid sitting or lying in bed for long periods of time without moving your legs.   Women, particularly those over the age of 65, should consider the risks and benefits of taking estrogen medicines, including birth control pills.   Do not smoke, especially if you take estrogen medicines.   Long-distance travel can increase your risk. You should exercise your legs by walking or pumping the muscles every hour.   In hospital prevention:   Your caregiver will assess your need for preventive PE care (prophylaxis) when you are admitted to the hospital. If you are having surgery, your surgeon will assess you the day of or day after surgery.   Prevention may include medical and nonmedical measures.  TREATMENT   The most common treatment for a PE is blood thinning (anticoagulant) medicine, which reduces the blood's tendency to clot. Anticoagulants can stop new blood clots from forming and old ones from growing. They cannot dissolve existing clots. Your body does this by itself over time. Anticoagulants can be given by mouth, by intravenous (IV) access, or by injection. Your caregiver will determine the best program for you.   Less commonly, clot-dissolving drugs (thrombolytics) are used to dissolve a PE. They  carry a high risk of bleeding, so they are used mainly in severe cases.   Very rarely, a blood clot in the leg needs to be removed surgically.   If you are unable to take anticoagulants, your caregiver may arrange for you to have a filter placed in a main vein in your belly (abdomen). This filter prevents clots from traveling to your lungs.  HOME CARE INSTRUCTIONS   Take all medicines prescribed by your caregiver. Follow the directions carefully.   You will most likely continue taking anticoagulants after you leave the hospital. Your  caregiver will advise you on the length of treatment (usually 3 to 6 months, sometimes for life).   Taking too much or too little of an anticoagulant is dangerous. While taking this type of medicine, you will need to have regular blood tests to be sure the dose is correct. The dose can change for many reasons. It is critically important that you take this medicine exactly as prescribed and that you have blood tests exactly as directed.   Many foods can interfere with anticoagulants. These include foods high in vitamin K, such as spinach, kale, broccoli, cabbage, collard and turnip greens, Brussels sprouts, peas, cauliflower, seaweed, parsley, beef and pork liver, green tea, and soybean oil. Your caregiver should discuss limits on these foods with you or you should arrange a visit with a dietician to answer your questions.   Many medicines can interfere with anticoagulants. You must tell your caregiver about any and all medicines you take. This includesall vitamins and supplements. Be especially cautious with aspirin and anti-inflammatory medicines. Ask your caregiver before taking these.   Anticoagulants can have side effects, mostly excessive bruising or bleeding. You will need to hold pressure over cuts for longer than usual. Avoid alcoholic drinks or consume only very small amounts while taking this medicine.   If you are taking an anticoagulant:   Wear a medical alert bracelet.   Notify your dentist or other caregivers before procedures.   Avoid contact sports.   Ask your caregiver how soon you can go back to normal activities. Not being active can lead to new clots. Ask for a list of what you should and should not do.   Exercise your lower leg muscles. This is important while traveling.   You may need to wear compression stockings. These are tight elastic stockings that apply pressure to the lower legs. This can help keep the blood in the legs from clotting.   If you are a smoker, you  should quit.   Learn as much as you can about pulmonary embolisms.  SEEK MEDICAL CARE IF:   You notice a rapid heartbeat.   You feel weaker or more tired than usual.   You feel faint.   You notice increased bruising.   Your symptoms are not getting better in the time expected.   You are having side effects of medicine.   You have an oral temperature above 102 F (38.9 C).   You discover other family members with blood clots. This may require further testing for inherited diseases or conditions.  SEEK IMMEDIATE MEDICAL CARE IF:   You have chest pain.   You have trouble breathing.   You have new or increased swelling or pain in one leg.   You cough up blood.   You notice blood in vomit, in a bowel movement, or in urine.   You have an oral temperature above 102 F (38.9 C), not controlled by medicine.    You may have another PE. A blood clot in the lungs is a medical emergency. Call your local emergency services (911 in [place] to get to the nearest hospital or clinic. Do not drive yourself. MAKE SURE YOU:   Understand these instructions.   Will watch your condition.   Will get help right away if you are not doing well or get worse.  Document Released: 06/02/2000 Document Revised: 05/25/2011 Document Reviewed: 12/07/2008 [facility] Patient Information 2012 [place], [place].  INTRUCTIONS Take Xarelto 15 mg twice a day with meals for 3 weeks. Make an appointment with Dr Scarlett Presto at [facility] for Follow up. Your treatment has to be extended to 3 months total, and the dose of this new medication has to be adjusted in three weeks.

## 2011-09-05 NOTE — Progress Notes (Signed)
Pt stated he emptied his ostomy bag and saw a little blood. Informed him to let us know next time for possible hemocult.Marland Kitchen

## 2011-09-05 NOTE — Progress Notes (Signed)
ANTICOAGULATION CONSULT NOTE - Follow Up Consult  Pharmacy Consult for heparin Indication: pulmonary embolus  Labs:  [facility] 09/05/11 0347 09/04/11 2051 09/04/11 1222 09/04/11 1011  HGB 10.7* 10.9* -- --  HCT 32.5* 33.2* -- 38.6*  PLT PENDING 121* -- --  APTT -- -- -- --  LABPROT -- 13.6 -- --  INR -- 1.02 -- --  HEPARINUNFRC 0.55 -- -- --  CREATININE 0.78 0.99 1.16 --  CKTOTAL 32 34 -- --  CKMB 3.0 3.1 -- --  TROPONINI <0.30 <0.30 -- --    Assessment/Plan: 61yo male therapeutic on heparin with initial dosing for PE.  Will continue heparin at current rate and confirm stable with additional level.   Colleen Can PharmD BCPS 09/05/2011,5:20 AM

## 2011-09-05 NOTE — H&P (Signed)
Family Medicine Teaching Service Attending Note  I interviewed and examined patient Tyler [name] and reviewed their tests and x-rays.  I discussed with Dr. Gwendolyn Grant and reviewed their note for today.  I agree with their assessment and plan.     Additionally  This AM he feels well and wants to go home No hypoxia or chest pain  Most likely diagnosis is pulmonary emboli from inactivity due to colectomy. Treat for this If symptoms do not resolve would get echo At risk for bleeding and can not tolerate oral iron.  Would give IV iron while here Can discharge once able to ambulate well and hgb is stable

## 2011-09-05 NOTE — Progress Notes (Signed)
Pt had < 1 sec run of SVT; pt asymptomatic, pt returned to NSR. MD notified, no orders given, will continue to monitor. Steele Berg RN

## 2011-09-05 NOTE — Progress Notes (Signed)
INITIAL ADULT NUTRITION ASSESSMENT Date: 09/05/2011   Time: 10:46 AM  Reason for Assessment: Nutrition Risk Report  ASSESSMENT: Male 61 y.o.  Dx: pulmonary embolism  Hx:  Past Medical History  Diagnosis Date  . Hyperlipidemia   . Crohn disease   . Anemia   . COPD (chronic obstructive pulmonary disease)   . Hypertension   . Arthritis   . Anxiety   . Joint pain    Related Meds:     . amitriptyline  50 mg Oral QHS  . amLODipine  10 mg Oral Daily  . heparin  4,500 Units Intravenous Once  . mupirocin ointment  1 application Topical Daily  . nitroGLYCERIN  0.3 mg Transdermal Daily  . predniSONE  40 mg Oral QAC breakfast  . sodium chloride  3 mL Intravenous Q12H  . sodium chloride  3 mL Intravenous Q12H  . tiotropium  18 mcg Inhalation Daily   Ht: 5\' 8"  (172.7 cm)  Wt: 148 lb 9.4 oz (67.4 kg)  Ideal Wt: 70 kg % Ideal Wt: 96%  Wt Readings from Last 5 Encounters:  09/04/11 148 lb 9.4 oz (67.4 kg)  09/04/11 150 lb 12.8 oz (68.402 kg)  08/19/11 154 lb (69.854 kg)  07/30/11 159 lb (72.122 kg)  07/03/11 161 lb (73.029 kg)  Usual Wt: 73 kg per pt % Usual Wt: 92%  Body mass index is 22.59 kg/(m^2). Weight is WNL.  Food/Nutrition Related Hx: diet appropriate PTA  Labs:  CMP     Component Value Date/Time   NA 136 09/05/2011 0347   K 3.9 09/05/2011 0347   CL 96 09/05/2011 0347   CO2 25 09/05/2011 0347   GLUCOSE 142* 09/05/2011 0347   BUN 15 09/05/2011 0347   CREATININE 0.78 09/05/2011 0347   CREATININE 1.16 09/04/2011 1222   CALCIUM 9.2 09/05/2011 0347   PROT 6.0 09/04/2011 2051   ALBUMIN 2.8* 09/04/2011 2051   AST 26 09/04/2011 2051   ALT 53 09/04/2011 2051   ALKPHOS 85 09/04/2011 2051   BILITOT 0.3 09/04/2011 2051   GFRNONAA >90 09/05/2011 0347   GFRAA >90 09/05/2011 0347   Intake/Output I/O last 3 completed shifts: In: 330 [P.O.:240; I.V.:90] Out: 650 [Stool:650] Total I/O In: 360 [P.O.:360] Out: -   Diet Order: Cardiac  Supplements/Tube Feeding: none  IVF:      heparin Last Rate: 1,100 Units/hr (09/04/11 2249)   Estimated Nutritional Needs:   Kcal: 1500 - 1700 kcal Protein:  74 - 85 grams protein Fluid:  1.6 - 1.8 L/d  RD drawn to chart 2/2 Nutrition Risk Report. Pt with hx of Crohn's s/p recent colectomy in mid-February. Admitted for dx of pulmonary embolism.  Pt does endorse 12 lb weight loss during February hospital stay 2/2 intermittent NPO status. This is an 8% weight loss. Pt is at nutrition risk given this weight loss, however wt has been stable since February discharge and appetite has remained appropriate.  Pt states that he was eating well prior to February hospitalization and prior to this hospitalization. This RD saw pt's breakfast tray, pt ate at least 75%.  NUTRITION DIAGNOSIS: none  MONITORING/EVALUATION(Goals): Goal: Pt to consume >/= 75% of meals. Monitor: PO intake, weights, labs, I/O's  EDUCATION NEEDS: -No education needs identified at this time  INTERVENTION: 1. Pt eating well, no interventions at this time. RD to continue to follow to ensure intake remains appropriate.  Dietitian #: [phone]  DOCUMENTATION CODES Per approved criteria  -Not Applicable    Bufford Buttner,  Mitzi Hansen 09/05/2011, 10:46 AM

## 2011-09-05 NOTE — Progress Notes (Signed)
I discussed with Dr Piloto.  I agree with their plans documented in their  Note for today.  

## 2011-09-06 DIAGNOSIS — Z0271 Encounter for disability determination: Secondary | ICD-10-CM

## 2011-09-06 LAB — CBC
HCT: 29.2 % — ABNORMAL LOW (ref 39.0–52.0)
Hemoglobin: 9.6 g/dL — ABNORMAL LOW (ref 13.0–17.0)
Hemoglobin: 9.6 g/dL — ABNORMAL LOW (ref 13.0–17.0)
MCH: 27.4 pg (ref 26.0–34.0)
MCHC: 32.9 g/dL (ref 30.0–36.0)
MCV: 84.6 fL (ref 78.0–100.0)
RBC: 3.51 MIL/uL — ABNORMAL LOW (ref 4.22–5.81)

## 2011-09-06 MED ORDER — OXYCODONE-ACETAMINOPHEN 5-325 MG PO TABS
1.0000 | ORAL_TABLET | Freq: Four times a day (QID) | ORAL | Status: DC | PRN
  Administered 2011-09-06 (×2): 2 via ORAL
  Administered 2011-09-06: 1 via ORAL
  Administered 2011-09-07 (×2): 2 via ORAL
  Filled 2011-09-06 (×4): qty 2
  Filled 2011-09-06: qty 1

## 2011-09-06 MED ORDER — FERUMOXYTOL INJECTION 510 MG/17 ML
510.0000 mg | Freq: Once | INTRAVENOUS | Status: AC
  Administered 2011-09-06: 510 mg via INTRAVENOUS
  Filled 2011-09-06: qty 17

## 2011-09-06 MED ORDER — PANTOPRAZOLE SODIUM 40 MG PO TBEC
40.0000 mg | DELAYED_RELEASE_TABLET | Freq: Two times a day (BID) | ORAL | Status: DC
  Administered 2011-09-06 – 2011-09-07 (×2): 40 mg via ORAL
  Filled 2011-09-06 (×2): qty 1

## 2011-09-06 MED ORDER — FERROUS SULFATE 325 (65 FE) MG PO TABS
325.0000 mg | ORAL_TABLET | Freq: Three times a day (TID) | ORAL | Status: DC
  Filled 2011-09-06 (×3): qty 1

## 2011-09-06 NOTE — Discharge Summary (Signed)
I discussed with Dr Piloto.  I agree with their plans documented in their  Note for today.  

## 2011-09-06 NOTE — Progress Notes (Signed)
Daily Progress Note Family Medicine Teaching Service PGY-1   Patient name: Tyler [name]  Medical record [id number] Date of birth:1951/01/07 Age: 61 y.o. Gender: male  LOS: 2 days   Subjective: NO SOB, no palpitations. Feeling better. No events overnight. Wants to go home.  Objective: Filed Vitals:   09/06/11 0507  BP: 124/84  Pulse: 107  Temp: 97.8 F (36.6 C)  Resp: 19   Physical Exam: Gen:  NAD HEENT: Moist mucous membranes CV: Regular rate and rhythm, no murmurs rubs or gallops PULM: Clear to auscultation bilaterally. No wheezes/rales/rhonchi ABD: Soft, non tender, non distended, normal bowel sounds. Colostomy pouch in place with normal coloration fecal material. EXT: No edema. Pt has 0.5 cm ulcer in 3rd right finger. Neuro: Alert and oriented x3. No focalization  Labs and imaging:  CBC  Lab 09/06/11 0535 09/05/11 0347 09/04/11 2051  WBC 8.4 8.1 9.3  HGB 9.6* 10.7* 10.9*  HCT 29.2* 32.5* 33.2*  PLT 121* 114* 121*   BMET  Lab 09/05/11 0347 09/04/11 2051 09/04/11 1222  NA 136 134* 136  K 3.9 3.8 3.6  CL 96 94* 95*  CO2 25 25 25   BUN 15 15 16   CREATININE 0.78 0.99 1.16  LABGLOM -- -- --  GLUCOSE 142* -- --  CALCIUM 9.2 9.2 9.1     Medications: Scheduled Meds:   . amitriptyline  50 mg Oral QHS  . amLODipine  10 mg Oral Daily  . mupirocin ointment  1 application Topical Daily  . nitroGLYCERIN  0.3 mg Transdermal Daily  . predniSONE  40 mg Oral QAC breakfast  . rivaroxaban  15 mg Oral BID WC  . sodium chloride  3 mL Intravenous Q12H  . sodium chloride  3 mL Intravenous Q12H  . tiotropium  18 mcg Inhalation Daily   Continuous Infusions:   . 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   1.  Pulmonary Embolism. Per CT angiogram. Was a concern the possibility of GI  Bleeding, pt with Hb stable.  Benefit of anticoagulant therapy outweigh risks at this point. - Xarellto 15 mg BID for 3 weeks follow by 20 mg daily to complete 3 months therapy.  2. Anemia: Secondary to GI bleed most likely. Pt ostomy has had positive hemocult. No frank blood present. Hb initially on the 12 grams  after surgery. Has been dropping  to 9.6 today.  Asymptomatic.  -PPI added (Pt on Prednisone) -will discuss if pt will benefit from IV iron therapy. -Will consider transfusion if symptomatic or Hb below 8. -CBC at noon and at 600 pm  3. Hypertension: controlled.  - Continue home medication: Norvasc 10mg  daily   4. Hand pain: secondary to vasculitic process (Buerger's). Seen by Dr. Kellie Simmering with rheumatology and [facility] hand specialists.  Continue prednisone 40mg  daily , tramadol 100mg  q6/prn for mild pain and Oxycodone PRN if severe pain.   5. Crohn's disease: s/p recent colectomy. Stable. On prednisone 40mg  daily. No active flare. Dr Cherlynn Kaiser here in [place] is who has managed his Crohn' s disease. Dr Bettey Mare at [facility] was the surgeon [phone]) -Will monitor ostomy output and any evidence of increased blood in stool.  -Wound care to assist with ostomy change.  6. Alcohol use: with history of 6-7 beers/night. No use of CIWA interventions -Continue  protocol.   Fen/Gi: Regular diet  Prophylaxis: PPI added today.  Dispo: Pt wants to go home. Possible if hb stable bet 9 to 8 with no symptoms ok to go home.   D. Piloto Rolene Arbour, MD PGY1, [facility] Medicine Teaching Service Pager [phone] 09/06/2011

## 2011-09-06 NOTE — Progress Notes (Signed)
I discussed with Dr Piloto.  I agree with their plans documented in their  Note for today.  

## 2011-09-06 NOTE — Discharge Summary (Addendum)
Physician Discharge Summary  Patient ID: Tyler [name] [id number] Jul 11, 1950 61 y.o.  Admit date: 09/04/2011 Discharge date: 09/06/2011  PCP: Tally Due, MD, MD   Discharge Diagnosis: 1. PE 2. Anemia 3. Crohn's disease s/p recent colectomy 4. HTN 5. Buerger's vasculitis  Discharge Medications  Jameson, Torm[place]  Home Medication Instructions [id number]   Printed on:09/06/11 1201  Medication Information                    amitriptyline (ELAVIL) 50 MG tablet Take 50 mg by mouth at bedtime.             albuterol (PROVENTIL HFA;VENTOLIN HFA) 108 (90 BASE) MCG/ACT inhaler Inhale 2 puffs into the lungs every 6 (six) hours as needed. For shortness of breath           predniSONE (DELTASONE) 10 MG tablet Take 40 mg by mouth daily.            amLODipine (NORVASC) 10 MG tablet Take 10 mg by mouth daily.             aspirin 325 MG tablet Take 1 tablet (325 mg total) by mouth daily.           oxyCODONE-acetaminophen (ROXICET) 5-325 MG per tablet Take 1-2 tablets q 6 hr prn pain.           nitroGLYCERIN (NITRODUR - DOSED IN MG/24 HR) 0.3 mg/hr Place 1 patch onto the skin daily.           traMADol (ULTRAM) 50 MG tablet Take 2 tablets (100 mg total) by mouth every 6 (six) hours as needed. For pain.           tiotropium (SPIRIVA) 18 MCG inhalation capsule Place 1 capsule (18 mcg total) into inhaler and inhale daily.           mupirocin ointment (BACTROBAN) 2 % Apply 1 application topically daily.           Rivaroxaban (XARELTO) 15 MG TABS tablet Take 1 tablet (15 mg total) by mouth 2 (two) times daily with a meal.              Consults: none. Telephone courtesy call to Dr. Bettey Mare, MD Surgeon who performed colostomy.   Labs: CBC  Lab 09/07/11 0525 09/06/11 1549 09/06/11 0535  WBC 6.7 7.3 8.4  HGB 9.9* 9.6* 9.6*  HCT 30.8* 29.7* 29.2*  PLT 129* 124* 121*   BMET  Lab 09/05/11 0347 09/04/11 2051 09/04/11 1222  NA 136 134* 136  K 3.9 3.8 3.6  CL  96 94* 95*  CO2 25 25 25   BUN 15 15 16   CREATININE 0.78 0.99 1.16  CALCIUM 9.2 9.2 9.1  PROT -- 6.0 --  BILITOT -- 0.3 --  ALKPHOS -- 85 --  ALT -- 53 --  AST -- 26 --  GLUCOSE 142* 144* 91   Results for orders placed during the hospital encounter of 09/04/11 (from the past 72 hour(s))  COMPREHENSIVE METABOLIC PANEL     Status: Abnormal   Collection Time   09/04/11  8:51 PM      Component Value Range Comment   Sodium 134 (*) 135 - 145 (mEq/L)    Potassium 3.8  3.5 - 5.1 (mEq/L)    Chloride 94 (*) 96 - 112 (mEq/L)    CO2 25  19 - 32 (mEq/L)    Glucose, Bld 144 (*) 70 - 99 (mg/dL)    BUN 15  6 -  23 (mg/dL)    Creatinine, Ser 1.61  0.50 - 1.35 (mg/dL)    Calcium 9.2  8.4 - 10.5 (mg/dL)    Total Protein 6.0  6.0 - 8.3 (g/dL)    Albumin 2.8 (*) 3.5 - 5.2 (g/dL)    AST 26  0 - 37 (U/L)    ALT 53  0 - 53 (U/L)    Alkaline Phosphatase 85  39 - 117 (U/L)    Total Bilirubin 0.3  0.3 - 1.2 (mg/dL)    GFR calc non Af Amer 88 (*) >90 (mL/min)    GFR calc Af Amer >90  >90 (mL/min)   CBC     Status: Abnormal   Collection Time   09/04/11  8:51 PM      Component Value Range Comment   WBC 9.3  4.0 - 10.5 (K/uL)    RBC 3.88 (*) 4.22 - 5.81 (MIL/uL)    Hemoglobin 10.9 (*) 13.0 - 17.0 (g/dL)    HCT 09.6 (*) 04.5 - 52.0 (%)    MCV 85.6  78.0 - 100.0 (fL)    MCH 28.1  26.0 - 34.0 (pg)    MCHC 32.8  30.0 - 36.0 (g/dL)    RDW 40.9 (*) 81.1 - 15.5 (%)    Platelets 121 (*) 150 - 400 (K/uL)   PROTIME-INR     Status: Normal   Collection Time   09/04/11  8:51 PM      Component Value Range Comment   Prothrombin Time 13.6  11.6 - 15.2 (seconds)    INR 1.02  0.00 - 1.49    CARDIAC PANEL(CRET KIN+CKTOT+MB+TROPI)     Status: Normal   Collection Time   09/04/11  8:51 PM      Component Value Range Comment   Total CK 34  7 - 232 (U/L)    CK, MB 3.1  0.3 - 4.0 (ng/mL)    Troponin I <0.30  <0.30 (ng/mL)    Relative Index RELATIVE INDEX IS INVALID  0.0 - 2.5    CARDIAC PANEL(CRET KIN+CKTOT+MB+TROPI)      Status: Normal   Collection Time   09/05/11  3:47 AM      Component Value Range Comment   Total CK 32  7 - 232 (U/L)    CK, MB 3.0  0.3 - 4.0 (ng/mL)    Troponin I <0.30  <0.30 (ng/mL)    Relative Index RELATIVE INDEX IS INVALID  0.0 - 2.5    CBC     Status: Abnormal   Collection Time   09/05/11  3:47 AM      Component Value Range Comment   WBC 8.1  4.0 - 10.5 (K/uL)    RBC 3.83 (*) 4.22 - 5.81 (MIL/uL)    Hemoglobin 10.7 (*) 13.0 - 17.0 (g/dL)    HCT 91.4 (*) 78.2 - 52.0 (%)    MCV 84.9  78.0 - 100.0 (fL)    MCH 27.9  26.0 - 34.0 (pg)    MCHC 32.9  30.0 - 36.0 (g/dL)    RDW 95.6 (*) 21.3 - 15.5 (%)    Platelets 114 (*) 150 - 400 (K/uL) PLATELET COUNT CONFIRMED BY SMEAR  BASIC METABOLIC PANEL     Status: Abnormal   Collection Time   09/05/11  3:47 AM      Component Value Range Comment   Sodium 136  135 - 145 (mEq/L)    Potassium 3.9  3.5 - 5.1 (mEq/L)    Chloride 96  96 -  112 (mEq/L)    CO2 25  19 - 32 (mEq/L)    Glucose, Bld 142 (*) 70 - 99 (mg/dL)    BUN 15  6 - 23 (mg/dL)    Creatinine, Ser 1.61  0.50 - 1.35 (mg/dL)    Calcium 9.2  8.4 - 10.5 (mg/dL)    GFR calc non Af Amer >90  >90 (mL/min)    GFR calc Af Amer >90  >90 (mL/min)   HEPARIN LEVEL (UNFRACTIONATED)     Status: Normal   Collection Time   09/05/11  3:47 AM      Component Value Range Comment   Heparin Unfractionated 0.55  0.30 - 0.70 (IU/mL)   OCCULT BLOOD X 1 CARD TO LAB, STOOL     Status: Normal   Collection Time   09/05/11  7:01 AM      Component Value Range Comment   Fecal Occult Bld POSITIVE     CARDIAC PANEL(CRET KIN+CKTOT+MB+TROPI)     Status: Normal   Collection Time   09/05/11 12:33 PM      Component Value Range Comment   Total CK 28  7 - 232 (U/L)    CK, MB 2.6  0.3 - 4.0 (ng/mL)    Troponin I <0.30  <0.30 (ng/mL)    Relative Index RELATIVE INDEX IS INVALID  0.0 - 2.5    CBC     Status: Abnormal   Collection Time   09/06/11  5:35 AM      Component Value Range Comment   WBC 8.4  4.0 - 10.5  (K/uL)    RBC 3.47 (*) 4.22 - 5.81 (MIL/uL)    Hemoglobin 9.6 (*) 13.0 - 17.0 (g/dL)    HCT 09.6 (*) 04.5 - 52.0 (%)    MCV 84.1  78.0 - 100.0 (fL)    MCH 27.7  26.0 - 34.0 (pg)    MCHC 32.9  30.0 - 36.0 (g/dL)    RDW 40.9 (*) 81.1 - 15.5 (%)    Platelets 121 (*) 150 - 400 (K/uL)     Procedures/Imaging:  Dg Chest 2 View 09/04/2011  OVERREAD BY [facility] *RADIOLOGY REPORT*  Clinical Data: Shortness of breath  CHEST - 2 VIEW  Comparison: None  Findings: Normal heart size, mediastinal contours, and pulmonary vascularity. Lungs clear. No pleural effusion or pneumothorax. Bones appear mildly demineralized.  IMPRESSION: No acute abnormalities.  Original Report Authenticated By: Lollie Marrow, M.D.   CTA scanned on Chart.   Physical Exam at Discharge: Gen: NAD  HEENT: Moist mucous membranes  CV: Regular rate and rhythm, no murmurs rubs or gallops  PULM: Clear to auscultation bilaterally. No wheezes/rales/rhonchi  ABD: Soft, non tender, non distended, normal bowel sounds. Colostomy pouch in place with normal coloration fecal material.  EXT: No edema. Pt has 0.5 cm ulcer in 3rd right finger.  Neuro: Alert and oriented x3. No focalization    Brief Hospital Course: 61 yo male with h/o crohn's (s/p recent colectomy),and Buerger's vasculitis, COPD, hypertension who presents with reported PE.  1. Pulmonary Embolism. Per CT angiogram. Benefit of anticoagulant therapy outweigh risks at this point. Xarellto 15 mg BID for 3 weeks follow by 20 mg daily to complete 3 months therapy.  2. Anemia: Secondary to GI bleed most likely. Pt ostomy has had positive hemocult. No frank blood present. Hb initially on the 12 grams after surgery. Has been dropping to 9.6 today. Asymptomatic. PPI added (Pt on Prednisone), will start on Iron therapy. Discharge home with  close f/u after two CBC with Hb stable.  3. Hypertension: controlled. Continued home medication: Norvasc 10mg  daily  4. Hand pain: secondary to  vasculitic process (Buerger's). Seen by Dr. Kellie Simmering with rheumatology and [facility] hand specialists. Continue prednisone 40mg  daily , tramadol 100mg  q6/prn for mild pain and Oxycodone PRN if severe pain.  5. Crohn's disease: s/p recent colectomy. Stable. On prednisone 40mg  daily. No active flare. Dr Cherlynn Kaiser here in [place] is who has managed his Crohn' s disease. Dr Bettey Mare at [facility] was the surgeon [phone])  6. Alcohol use: with history of 6-7 beers/night. No use of CIWA interventions   Ptient condition at time of discharge/disposition:  Patient is discharge home on stable medical condition.   Follow up issues: 1.Hb   D. [place] Rolene Arbour, MD  [facility] 09/06/2011

## 2011-09-07 ENCOUNTER — Telehealth: Payer: Self-pay

## 2011-09-07 DIAGNOSIS — N028 Recurrent and persistent hematuria with other morphologic changes: Secondary | ICD-10-CM

## 2011-09-07 LAB — CBC
HCT: 30.8 % — ABNORMAL LOW (ref 39.0–52.0)
MCH: 27.9 pg (ref 26.0–34.0)
MCHC: 32.1 g/dL (ref 30.0–36.0)
MCV: 86.8 fL (ref 78.0–100.0)
RDW: 19.4 % — ABNORMAL HIGH (ref 11.5–15.5)

## 2011-09-07 MED ORDER — BIOTENE DRY MOUTH MT LIQD: 15.0000 mL | Freq: Two times a day (BID) | OROMUCOSAL | Status: DC

## 2011-09-07 MED ORDER — OXYCODONE-ACETAMINOPHEN 5-325 MG PO TABS: ORAL_TABLET | ORAL | Status: DC

## 2011-09-07 MED ORDER — CHLORHEXIDINE GLUCONATE 0.12 % MT SOLN: 15.0000 mL | Freq: Two times a day (BID) | OROMUCOSAL | Status: DC

## 2011-09-07 MED ORDER — PANTOPRAZOLE SODIUM 40 MG PO TBEC: 40.0000 mg | DELAYED_RELEASE_TABLET | Freq: Two times a day (BID) | ORAL | Status: DC

## 2011-09-07 NOTE — Discharge Summary (Signed)
I have seen and examined this patient. I have discussed with Dr Aviva Signs.  I agree with their findings and plans as documented in their Discharge note for today.

## 2011-09-07 NOTE — Progress Notes (Signed)
09-07-11 UR completed. Drucilla Cumber RN BSN  

## 2011-09-07 NOTE — Telephone Encounter (Signed)
Pt will be released from hospital today and will need some percocet for pain.   Pt was impressed with Dr. Perrin Maltese discovering his blood clot.   Pt will come by practice this afternoon (four o'clock) to see about getting pain medication prescription.

## 2011-09-07 NOTE — Progress Notes (Signed)
Medications and follow up appt reviewed with patient. IV DC'd. Condition stable upon departure.

## 2011-09-07 NOTE — Telephone Encounter (Signed)
Pt came in to get RX for Oxycodone. Dr Perrin Maltese wasn't here so Marylene Land wrote RX for pt. Pt given RX up front

## 2011-09-08 ENCOUNTER — Ambulatory Visit (INDEPENDENT_AMBULATORY_CARE_PROVIDER_SITE_OTHER): Payer: Managed Care, Other (non HMO) | Admitting: Internal Medicine

## 2011-09-08 VITALS — BP 129/77 | HR 116 | Temp 97.6°F | Resp 18 | Ht 68.0 in | Wt 161.0 lb

## 2011-09-08 DIAGNOSIS — I2699 Other pulmonary embolism without acute cor pulmonale: Secondary | ICD-10-CM

## 2011-09-08 DIAGNOSIS — D649 Anemia, unspecified: Secondary | ICD-10-CM

## 2011-09-08 DIAGNOSIS — Z7189 Other specified counseling: Secondary | ICD-10-CM

## 2011-09-08 NOTE — Progress Notes (Signed)
  Subjective:    Patient ID: Tyler [name], male    DOB: 1950/10/15, 61 y.o.   MRN: [id number]  HPI Very complex recent hx PE treated with rivaroxaban-reviewed in epocrates. Anemia will need cbc 3 wks Recent partial colectomy  Review of Systems    See hospital w/up Objective:   Physical Exam  Stable to thorough exam      Assessment & Plan:  RTC 3 wks no changes

## 2011-09-19 ENCOUNTER — Ambulatory Visit (INDEPENDENT_AMBULATORY_CARE_PROVIDER_SITE_OTHER): Payer: Managed Care, Other (non HMO) | Admitting: Internal Medicine

## 2011-09-19 VITALS — BP 148/95 | HR 121 | Temp 97.6°F | Resp 18 | Ht 68.5 in | Wt 150.8 lb

## 2011-09-19 DIAGNOSIS — J449 Chronic obstructive pulmonary disease, unspecified: Secondary | ICD-10-CM

## 2011-09-19 DIAGNOSIS — M19049 Primary osteoarthritis, unspecified hand: Secondary | ICD-10-CM

## 2011-09-19 DIAGNOSIS — R0602 Shortness of breath: Secondary | ICD-10-CM

## 2011-09-19 DIAGNOSIS — I2699 Other pulmonary embolism without acute cor pulmonale: Secondary | ICD-10-CM

## 2011-09-19 DIAGNOSIS — R000 Tachycardia, unspecified: Secondary | ICD-10-CM | POA: Insufficient documentation

## 2011-09-19 MED ORDER — OXYCODONE-ACETAMINOPHEN 10-325 MG PO TABS: 1.0000 | ORAL_TABLET | Freq: Three times a day (TID) | ORAL | Status: DC | PRN

## 2011-09-19 NOTE — Progress Notes (Signed)
  Subjective:    Patient ID: Tyler [name], male    DOB: 02/06/1951, 61 y.o.   MRN: [id number]  HPI More or persistant SOB. Has hand pain and requests percoset. Pulse is still high No cough or sputum or resp illness Review of Systems     Objective:   Physical Exam Heart RR 115/min Lungs decreased BS all fields Hands much better color     Assessment & Plan:  Refer urgently to pulmon ary Use albuterol more prn Disscussed with Rheumatology Dr. Kellie Simmering  Reviewed all previous visits and hospitalizations

## 2011-09-19 NOTE — Patient Instructions (Signed)
Pulmonary Embolus A pulmonary (lung) embolus (PE) is a blood clot that has traveled from another place in the body to the lung. Most clots come from deep veins in the legs or pelvis. PE is a dangerous and potentially life-threatening condition that can be treated if identified. CAUSES Blood clots form in a vein for different reasons. Usually several things cause blood clots. They include:  The flow of blood slows down.   The inside of the vein is damaged in some way.   The person has a condition that makes the blood clot more easily. These conditions may include:   Older age (especially over 75 years old).   Having a history of blood clots.   Having major or lengthy surgery. Hip surgery is particularly high-risk.   Breaking a hip or leg.   Sitting or lying still for a long time.   Cancer or cancer treatment.   Having a long, thin tube (catheter) placed inside a vein during a medical procedure.   Being overweight (obese).   Pregnancy and childbirth.   Medicines with estrogen.   Smoking.   Other circulation or heart problems.  SYMPTOMS  The symptoms of a PE usually start suddenly and include:  Shortness of breath.   Coughing.   Coughing up blood or blood-tinged mucus (phlegm).   Chest pain. Pain is often worse with deep breaths.   Rapid heartbeat.  DIAGNOSIS  If a PE is suspected, your caregiver will take a medical history and carry out a physical exam. Your caregiver will check for the risk factors listed above. Tests that also may be required include:  Blood tests, including studies of the clotting properties of your blood.   Imaging tests. Ultrasound, CT, MRI, and other tests can all be used to see if you have clots in your legs or lungs. If you have a clot in your legs and have breathing or chest problems, your caregiver may conclude that you have a clot in your lungs. Further lung tests may not be needed.   An EKG can look for heart strain from blood clots in  the lungs.  PREVENTION   Exercise the legs regularly. Take a brisk 30 minute walk every day.   Maintain a weight that is appropriate for your height.   Avoid sitting or lying in bed for long periods of time without moving your legs.   Women, particularly those over the age of 35, should consider the risks and benefits of taking estrogen medicines, including birth control pills.   Do not smoke, especially if you take estrogen medicines.   Long-distance travel can increase your risk. You should exercise your legs by walking or pumping the muscles every hour.   In hospital prevention:   Your caregiver will assess your need for preventive PE care (prophylaxis) when you are admitted to the hospital. If you are having surgery, your surgeon will assess you the day of or day after surgery.   Prevention may include medical and nonmedical measures.  TREATMENT   The most common treatment for a PE is blood thinning (anticoagulant) medicine, which reduces the blood's tendency to clot. Anticoagulants can stop new blood clots from forming and old ones from growing. They cannot dissolve existing clots. Your body does this by itself over time. Anticoagulants can be given by mouth, by intravenous (IV) access, or by injection. Your caregiver will determine the best program for you.   Less commonly, clot-dissolving drugs (thrombolytics) are used to dissolve a PE. They   carry a high risk of bleeding, so they are used mainly in severe cases.   Very rarely, a blood clot in the leg needs to be removed surgically.   If you are unable to take anticoagulants, your caregiver may arrange for you to have a filter placed in a main vein in your belly (abdomen). This filter prevents clots from traveling to your lungs.  HOME CARE INSTRUCTIONS   Take all medicines prescribed by your caregiver. Follow the directions carefully.   You will most likely continue taking anticoagulants after you leave the hospital. Your  caregiver will advise you on the length of treatment (usually 3 to 6 months, sometimes for life).   Taking too much or too little of an anticoagulant is dangerous. While taking this type of medicine, you will need to have regular blood tests to be sure the dose is correct. The dose can change for many reasons. It is critically important that you take this medicine exactly as prescribed and that you have blood tests exactly as directed.   Many foods can interfere with anticoagulants. These include foods high in vitamin K, such as spinach, kale, broccoli, cabbage, collard and turnip greens, Brussels sprouts, peas, cauliflower, seaweed, parsley, beef and pork liver, green tea, and soybean oil. Your caregiver should discuss limits on these foods with you or you should arrange a visit with a dietician to answer your questions.   Many medicines can interfere with anticoagulants. You must tell your caregiver about any and all medicines you take. This includesall vitamins and supplements. Be especially cautious with aspirin and anti-inflammatory medicines. Ask your caregiver before taking these.   Anticoagulants can have side effects, mostly excessive bruising or bleeding. You will need to hold pressure over cuts for longer than usual. Avoid alcoholic drinks or consume only very small amounts while taking this medicine.   If you are taking an anticoagulant:   Wear a medical alert bracelet.   Notify your dentist or other caregivers before procedures.   Avoid contact sports.   Ask your caregiver how soon you can go back to normal activities. Not being active can lead to new clots. Ask for a list of what you should and should not do.   Exercise your lower leg muscles. This is important while traveling.   You may need to wear compression stockings. These are tight elastic stockings that apply pressure to the lower legs. This can help keep the blood in the legs from clotting.   If you are a smoker, you  should quit.   Learn as much as you can about pulmonary embolisms.  SEEK MEDICAL CARE IF:   You notice a rapid heartbeat.   You feel weaker or more tired than usual.   You feel faint.   You notice increased bruising.   Your symptoms are not getting better in the time expected.   You are having side effects of medicine.   You have an oral temperature above 102 F (38.9 C).   You discover other family members with blood clots. This may require further testing for inherited diseases or conditions.  SEEK IMMEDIATE MEDICAL CARE IF:   You have chest pain.   You have trouble breathing.   You have new or increased swelling or pain in one leg.   You cough up blood.   You notice blood in vomit, in a bowel movement, or in urine.   You have an oral temperature above 102 F (38.9 C), not controlled by medicine.    You may have another PE. A blood clot in the lungs is a medical emergency. Call your local emergency services (911 in [place] to get to the nearest hospital or clinic. Do not drive yourself. MAKE SURE YOU:   Understand these instructions.   Will watch your condition.   Will get help right away if you are not doing well or get worse.  Document Released: 06/02/2000 Document Revised: 05/25/2011 Document Reviewed: 12/07/2008 [employer] Patient Information 2012 [employer]. 

## 2011-09-25 ENCOUNTER — Encounter (INDEPENDENT_AMBULATORY_CARE_PROVIDER_SITE_OTHER): Payer: Managed Care, Other (non HMO)

## 2011-09-25 ENCOUNTER — Ambulatory Visit (INDEPENDENT_AMBULATORY_CARE_PROVIDER_SITE_OTHER): Payer: Managed Care, Other (non HMO) | Admitting: Pulmonary Disease

## 2011-09-25 ENCOUNTER — Encounter: Payer: Self-pay | Admitting: Pulmonary Disease

## 2011-09-25 VITALS — BP 138/88 | HR 107 | Temp 98.0°F | Ht 69.0 in | Wt 162.0 lb

## 2011-09-25 DIAGNOSIS — I2699 Other pulmonary embolism without acute cor pulmonale: Secondary | ICD-10-CM

## 2011-09-25 DIAGNOSIS — R0609 Other forms of dyspnea: Secondary | ICD-10-CM | POA: Insufficient documentation

## 2011-09-25 DIAGNOSIS — R0602 Shortness of breath: Secondary | ICD-10-CM

## 2011-09-25 NOTE — Patient Instructions (Signed)
Will schedule for an ultrasound of your legs to make sure no residual clots Will schedule for echo of your heart to make sure the heart is working properly and the pressures are not too high. Will send Dr. Perrin Maltese a note regarding management of your blood thinners. If your breathing does not return to your baseline over the next 4-6 mos, I will need to see you again.  Will call you with results of testing.

## 2011-09-25 NOTE — Assessment & Plan Note (Signed)
The patient has had significant worsening of his dyspnea on exertion after having a pulmonary embolism last month.  Most likely this was related to his surgery the month before, and therefore is not considered an unprovoked event.  He has already been started on xarelto, and will need treatment for a total of 3 months.  It does not appear that he has had lower extremity venous Dopplers to evaluate his risk for recurrence, nor has he had an echocardiogram to evaluate his right ventricular function and pulmonary artery pressures.  We'll go ahead and do this.  If these appear adequate, I suspect the patient's breathing will return to baseline over the next 4-6 months after he has had complete treatment for his thromboembolic event.  He will need to have his anticoagulation changed to 20 mg once a day to finish up his 3 months.  I will send a note to his primary care physician informing him of this.

## 2011-09-25 NOTE — Assessment & Plan Note (Signed)
The patient has a history of chronic dyspnea on exertion that is related to his COPD, however, he was satisfied with his breathing level on his current medications.  He noticed the significant worsening after he had his pulmonary embolus, and he has not gotten back to baseline as of yet.  I have explained to him that we will rule out complications from his PE, but that I would expect it to take 4-6 months before he will be able to return to his usual baseline.

## 2011-09-25 NOTE — Progress Notes (Signed)
  Subjective:    Patient ID: Tyler [name], male    DOB: [birth year], 61 y.o.   MRN: [id number]  HPI The patient is a 61 year old male who been asked to see for a recent pulmonary embolus and dyspnea on exertion.  He recently had to undergo emergency colectomy with colostomy related to a complication from Crohn's disease, and shortly thereafter was found to have a pulmonary embolus for which he was started on xarelto.  The patient states prior to his colon surgery he felt that his breathing was fairly well compensated.  He has a known history of COPD, and felt that he had a reasonable baseline on his current medications.  Obviously, after his surgery, his breathing worsened to some degree, but has improved as he got stronger.  He then noted significant worsening dyspnea on exertion that was fairly rapid, and subsequently found to have his pulmonary embolus.  He feels that his breathing is at least 50% improved from his initial event.  I do not have his formal report, but I have reviewed his CT chest which appears to have small pulmonary emboli in both lower lobes.  The patient does not think he had an ultrasound of his lower extremities or an echocardiogram while in the hospital, and I see no record of this in the Epic computer system.  He has not had leg discomfort or swelling, nor has he had any pleuritic chest pain.   Review of Systems  Constitutional: Negative for fever and unexpected weight change.  HENT: Positive for congestion. Negative for ear pain, nosebleeds, sore throat, rhinorrhea, sneezing, trouble swallowing, dental problem, postnasal drip and sinus pressure.   Eyes: Negative for redness and itching.  Respiratory: Positive for shortness of breath. Negative for cough, chest tightness and wheezing.   Cardiovascular: Negative for palpitations and leg swelling.  Gastrointestinal: Negative for nausea and vomiting.  Genitourinary: Negative for dysuria.  Musculoskeletal: Positive for joint  swelling.  Skin: Negative for rash.  Neurological: Negative for headaches.  Hematological: Does not bruise/bleed easily.  Psychiatric/Behavioral: Negative for dysphoric mood. The patient is not nervous/anxious.        Objective:   Physical Exam Constitutional:  Obese male, no acute distress.  cushinoid facies  HENT:  Nares patent without discharge, deviated septum to left with narrowing  Oropharynx without exudate, palate and uvula are thickened and elongated.  Eyes:  Perrla, eomi, no scleral icterus  Neck:  No JVD, no TMG  Cardiovascular:  Normal rate, regular rhythm, no rubs or gallops.  No murmurs        Intact distal pulses  Pulmonary :  Normal breath sounds, no stridor or respiratory distress   No rales, rhonchi, or wheezing  Abdominal:  Soft, nondistended, bowel sounds present.  No tenderness noted.  +colostomy  Musculoskeletal:  No lower extremity edema noted.  Lymph Nodes:  No cervical lymphadenopathy noted  Skin:  No cyanosis noted  Neurologic:  Alert, appropriate, moves all 4 extremities without obvious deficit.         Assessment & Plan:

## 2011-09-27 ENCOUNTER — Other Ambulatory Visit: Payer: Self-pay | Admitting: Internal Medicine

## 2011-09-29 ENCOUNTER — Ambulatory Visit (INDEPENDENT_AMBULATORY_CARE_PROVIDER_SITE_OTHER): Payer: Managed Care, Other (non HMO) | Admitting: Internal Medicine

## 2011-09-29 VITALS — BP 137/81 | HR 123 | Temp 98.1°F | Resp 20 | Ht 68.0 in | Wt 154.8 lb

## 2011-09-29 DIAGNOSIS — R000 Tachycardia, unspecified: Secondary | ICD-10-CM

## 2011-09-29 DIAGNOSIS — I2699 Other pulmonary embolism without acute cor pulmonale: Secondary | ICD-10-CM

## 2011-09-29 DIAGNOSIS — R7309 Other abnormal glucose: Secondary | ICD-10-CM

## 2011-09-29 DIAGNOSIS — G8929 Other chronic pain: Secondary | ICD-10-CM

## 2011-09-29 DIAGNOSIS — R739 Hyperglycemia, unspecified: Secondary | ICD-10-CM

## 2011-09-29 DIAGNOSIS — J40 Bronchitis, not specified as acute or chronic: Secondary | ICD-10-CM

## 2011-09-29 DIAGNOSIS — J449 Chronic obstructive pulmonary disease, unspecified: Secondary | ICD-10-CM

## 2011-09-29 LAB — GLUCOSE, POCT (MANUAL RESULT ENTRY): POC Glucose: 83

## 2011-09-29 MED ORDER — PREDNISONE 5 MG PO TABS: 5.0000 mg | ORAL_TABLET | Freq: Three times a day (TID) | ORAL | Status: DC

## 2011-09-29 MED ORDER — TRAMADOL HCL 50 MG PO TABS: 100.0000 mg | ORAL_TABLET | Freq: Three times a day (TID) | ORAL | Status: AC | PRN

## 2011-09-29 MED ORDER — RIVAROXABAN 20 MG PO TABS: 20.0000 mg | ORAL_TABLET | Freq: Every day | ORAL | Status: DC

## 2011-09-29 NOTE — Progress Notes (Signed)
  Subjective:    Patient ID: Tyler [name], male    DOB: Jan 16, 1951, 60 y.o.   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 Needs dose adjustment to 20mg  qd Buerger's disease with ischemic finger tips COPD, SOB and tachycardia post PE. Anemia post bowel resection with colostomy/Ileostomy Chronic pain needs to reduce strength of pain med to work   Review of Systems Has quit smoking!!    Objective:   Physical Exam  Constitutional: He is oriented to person, place, and time. He appears well-developed. He is cooperative. No distress.       Has steroid facial swellin  Eyes: EOM are normal.  Neck: Normal range of motion. Neck supple.  Cardiovascular: Regular rhythm and normal heart sounds.  Tachycardia present.   Pulmonary/Chest: Effort normal. No respiratory distress. He has decreased breath sounds. He has rhonchi.  Abdominal: Soft. Bowel sounds are normal. He exhibits no distension. There is no tenderness.       Ileostomy  Musculoskeletal: He exhibits tenderness.  Neurological: He is alert and oriented to person, place, and time. Coordination normal.  Psychiatric: He has a normal mood and affect.    Results for orders placed in visit on 09/29/11  POCT CBC      Component Value Range   WBC 9.1  4.6 - 10.2 (K/uL)   Lymph, poc 2.0  0.6 - 3.4    POC LYMPH PERCENT 22.1  10 - 50 (%L)   MID (cbc) 0.7  0 - 0.9    POC MID % 7.4  0 - 12 (%M)   POC Granulocyte 6.4  2 - 6.9    Granulocyte percent 70.5  37 - 80 (%G)   RBC 3.88 (*) 4.69 - 6.13 (M/uL)   Hemoglobin 12.2 (*) 14.1 - 18.1 (g/dL)   HCT, POC 40.9 (*) 81.1 - 53.7 (%)   MCV 96.7  80 - 97 (fL)   MCH, POC 31.4 (*) 27 - 31.2 (pg)   MCHC 32.5  31.8 - 35.4 (g/dL)   RDW, POC 91.4     Platelet Count, POC 244  142 - 424 (K/uL)   MPV 6.4  0 - 99.8 (fL)  GLUCOSE, POCT (MANUAL RESULT ENTRY)      Component Value Range   POC Glucose 83          Assessment & Plan:  Reduce xarelto to 20mg  qd Refill tramadol for pain Refill prednisone  for breathing and hand pain  RTC 2 months do CBC

## 2011-10-02 ENCOUNTER — Ambulatory Visit (HOSPITAL_COMMUNITY): Payer: Managed Care, Other (non HMO) | Attending: Internal Medicine

## 2011-10-02 ENCOUNTER — Other Ambulatory Visit: Payer: Self-pay

## 2011-10-02 DIAGNOSIS — Z8673 Personal history of transient ischemic attack (TIA), and cerebral infarction without residual deficits: Secondary | ICD-10-CM | POA: Insufficient documentation

## 2011-10-02 DIAGNOSIS — I10 Essential (primary) hypertension: Secondary | ICD-10-CM | POA: Insufficient documentation

## 2011-10-02 DIAGNOSIS — I2699 Other pulmonary embolism without acute cor pulmonale: Secondary | ICD-10-CM | POA: Insufficient documentation

## 2011-10-02 DIAGNOSIS — R000 Tachycardia, unspecified: Secondary | ICD-10-CM | POA: Insufficient documentation

## 2011-10-02 DIAGNOSIS — J4489 Other specified chronic obstructive pulmonary disease: Secondary | ICD-10-CM | POA: Insufficient documentation

## 2011-10-02 DIAGNOSIS — I519 Heart disease, unspecified: Secondary | ICD-10-CM | POA: Insufficient documentation

## 2011-10-02 DIAGNOSIS — Z87891 Personal history of nicotine dependence: Secondary | ICD-10-CM | POA: Insufficient documentation

## 2011-10-02 DIAGNOSIS — I2510 Atherosclerotic heart disease of native coronary artery without angina pectoris: Secondary | ICD-10-CM | POA: Insufficient documentation

## 2011-10-02 DIAGNOSIS — R0989 Other specified symptoms and signs involving the circulatory and respiratory systems: Secondary | ICD-10-CM | POA: Insufficient documentation

## 2011-10-02 DIAGNOSIS — R0609 Other forms of dyspnea: Secondary | ICD-10-CM | POA: Insufficient documentation

## 2011-10-02 DIAGNOSIS — J449 Chronic obstructive pulmonary disease, unspecified: Secondary | ICD-10-CM | POA: Insufficient documentation

## 2011-10-06 ENCOUNTER — Encounter: Payer: Self-pay | Admitting: Internal Medicine

## 2011-10-06 ENCOUNTER — Ambulatory Visit (INDEPENDENT_AMBULATORY_CARE_PROVIDER_SITE_OTHER): Payer: Managed Care, Other (non HMO) | Admitting: Internal Medicine

## 2011-10-06 VITALS — BP 135/81 | HR 125 | Temp 97.8°F | Resp 24 | Ht 68.5 in | Wt 158.6 lb

## 2011-10-06 DIAGNOSIS — I498 Other specified cardiac arrhythmias: Secondary | ICD-10-CM

## 2011-10-06 DIAGNOSIS — J449 Chronic obstructive pulmonary disease, unspecified: Secondary | ICD-10-CM

## 2011-10-06 DIAGNOSIS — N028 Recurrent and persistent hematuria with other morphologic changes: Secondary | ICD-10-CM

## 2011-10-06 DIAGNOSIS — M79643 Pain in unspecified hand: Secondary | ICD-10-CM

## 2011-10-06 DIAGNOSIS — R000 Tachycardia, unspecified: Secondary | ICD-10-CM

## 2011-10-06 DIAGNOSIS — M79609 Pain in unspecified limb: Secondary | ICD-10-CM

## 2011-10-06 MED ORDER — ALBUTEROL SULFATE HFA 108 (90 BASE) MCG/ACT IN AERS: 2.0000 | INHALATION_SPRAY | Freq: Four times a day (QID) | RESPIRATORY_TRACT | Status: DC | PRN

## 2011-10-06 MED ORDER — METOPROLOL SUCCINATE ER 50 MG PO TB24: 100.0000 mg | ORAL_TABLET | Freq: Every day | ORAL | Status: DC

## 2011-10-06 MED ORDER — PREDNISONE 20 MG PO TABS: 20.0000 mg | ORAL_TABLET | Freq: Every day | ORAL | Status: AC

## 2011-10-06 MED ORDER — OXYCODONE-ACETAMINOPHEN 10-325 MG PO TABS: 1.0000 | ORAL_TABLET | Freq: Three times a day (TID) | ORAL | Status: AC | PRN

## 2011-10-06 NOTE — Progress Notes (Signed)
  Subjective:    Patient ID: Tyler [name], male    DOB: 27-Jan-1951, 61 y.o.   MRN: [id number]  HPIHere for followup Problem #1 hand pain Problem #2 recent pulmonary embolus Problem #3 status post colectomy for Crohn's disease one month ago Problem #4 vasculitis  He is planning to return to work on Monday Pulmonary evaluation says he is stable He is complaining of continued shortness of breath despite his inhalers and prednisone He has long had a rapid pulse which has not responded to 50 mg of metoprolol Echocardiogram this week was normal He has not had a cardiac evaluation  His pain medication with tramadol is not enough to control his hand pain and he would like to go back to oxycodone/his prednisone reduction to 10 mg has not been successful either    Review of SystemsDenies chest pain/no paroxysmal nocturnal dyspnea No cough His vasculitic ulcer on his finger is not healing     Objective:   Physical ExamObvious moon facies Pulse 125 Chest is clear to auscultation Heart is regular without murmur or gallop There is no peripheral edema         Assessment & Plan:  Problem #1 COPD with inadequate control Problem #2 recent pulmonary embolus Problem #3 vasculitis/Berger's disease Problem #4 pain hand Problem #5 sinus tachycardia  Oxycodone 10/325 3 times a day #90 Prednisone 20 mg daily #30 Refill Ventolin  He is to return to work Monday and be in [place] for 3 weeks I have recommended that we hold him out of work to get a cardiac evaluation and to stabilize his shortness of breath and he will call on Monday to see if he can do this Increase metoprolol tartrate 100

## 2011-10-11 ENCOUNTER — Other Ambulatory Visit: Payer: Self-pay | Admitting: Internal Medicine

## 2011-11-06 ENCOUNTER — Telehealth: Payer: Self-pay

## 2011-11-06 NOTE — Telephone Encounter (Signed)
[facility] FROM [place] STATES THEY NEED A REFILL REQUEST ON PT'S PANTOPRAZOLE 40MG S AND HYDROCODONE PLEASE CALL [phone] AND SINCE THIS IS A RESTRICTED DRUG SO THEY COULDN'T FAX

## 2011-11-07 ENCOUNTER — Telehealth: Payer: Self-pay

## 2011-11-07 MED ORDER — HYDROCODONE-ACETAMINOPHEN 7.5-325 MG PO TABS: 1.0000 | ORAL_TABLET | Freq: Three times a day (TID) | ORAL | Status: AC | PRN

## 2011-11-07 MED ORDER — PANTOPRAZOLE SODIUM 40 MG PO TBEC: 40.0000 mg | DELAYED_RELEASE_TABLET | Freq: Two times a day (BID) | ORAL | Status: DC

## 2011-11-07 NOTE — Telephone Encounter (Signed)
I don't see that we have given him hydrocodone.  We have given him oxycodone.  Clarify please

## 2011-11-07 NOTE — Telephone Encounter (Signed)
See other phone message about this.

## 2011-11-07 NOTE — Telephone Encounter (Signed)
Protonix sent.  Hydrocodone printed.

## 2011-11-07 NOTE — Telephone Encounter (Signed)
He has had a RX filled by Dr. Perrin Maltese for Vicodin 7.5/325 in January. and he is wanting this one refilled also the Protonix 40mg . Chart is at [facility].

## 2011-11-07 NOTE — Telephone Encounter (Signed)
PT STATES HE ONLY RECEIVED ONE OF HIS MEDICINE AND DIDN'T GET THE HYDROCODONE WHICH HE REALLY NEED PLEASE CALL [phone]   [facility] IN [place]

## 2011-11-08 NOTE — Telephone Encounter (Signed)
Spoke with pt to let him know we could call rx in and protonix sent in called pharmacy and spoke with pharmacist, also will fax them the prescription at [phone].

## 2011-11-16 ENCOUNTER — Ambulatory Visit (INDEPENDENT_AMBULATORY_CARE_PROVIDER_SITE_OTHER): Payer: Managed Care, Other (non HMO) | Admitting: Internal Medicine

## 2011-11-16 ENCOUNTER — Ambulatory Visit: Payer: Managed Care, Other (non HMO)

## 2011-11-16 ENCOUNTER — Telehealth: Payer: Self-pay | Admitting: Pulmonary Disease

## 2011-11-16 VITALS — BP 127/89 | HR 102 | Temp 98.3°F | Resp 20 | Ht 69.0 in | Wt 160.0 lb

## 2011-11-16 DIAGNOSIS — G4700 Insomnia, unspecified: Secondary | ICD-10-CM

## 2011-11-16 DIAGNOSIS — I731 Thromboangiitis obliterans [Buerger's disease]: Secondary | ICD-10-CM

## 2011-11-16 DIAGNOSIS — G8929 Other chronic pain: Secondary | ICD-10-CM

## 2011-11-16 DIAGNOSIS — R0602 Shortness of breath: Secondary | ICD-10-CM

## 2011-11-16 DIAGNOSIS — K5090 Crohn's disease, unspecified, without complications: Secondary | ICD-10-CM

## 2011-11-16 DIAGNOSIS — J9801 Acute bronchospasm: Secondary | ICD-10-CM

## 2011-11-16 DIAGNOSIS — I2699 Other pulmonary embolism without acute cor pulmonale: Secondary | ICD-10-CM

## 2011-11-16 MED ORDER — OXYCODONE HCL 10 MG PO TB12: 10.0000 mg | ORAL_TABLET | Freq: Two times a day (BID) | ORAL | Status: DC

## 2011-11-16 MED ORDER — IPRATROPIUM BROMIDE 0.02 % IN SOLN: 500.0000 ug | Freq: Four times a day (QID) | RESPIRATORY_TRACT | Status: DC

## 2011-11-16 MED ORDER — ALBUTEROL SULFATE (2.5 MG/3ML) 0.083% IN NEBU
2.5000 mg | INHALATION_SOLUTION | Freq: Once | RESPIRATORY_TRACT | Status: AC
  Administered 2011-11-16: 2.5 mg via RESPIRATORY_TRACT

## 2011-11-16 MED ORDER — ALPRAZOLAM 1 MG PO TABS: 1.0000 mg | ORAL_TABLET | Freq: Every evening | ORAL | Status: AC | PRN

## 2011-11-16 NOTE — Telephone Encounter (Signed)
Chart has been requested. Chart is located in the warehouse and may take a few days to arrive.

## 2011-11-16 NOTE — Telephone Encounter (Signed)
Does the pt need to come back for an appt to get surgical clearance. You last saw the pt 09/25/11. Pls advise.

## 2011-11-16 NOTE — Progress Notes (Signed)
  Subjective:    Patient ID: Tyler [name], male    DOB: 04-Dec-1950, 61 y.o.   MRN: [id number]  HPI Is more sob, is sched for surgery to reconnect colon and close colostomey. This will help him psychologicaly a lot. Worried all the time about bag falling off. Still on blood thinner for PE. Occ smokes still! R chest pain  Review of Systems same    Objective:   Physical Exam  Constitutional: He is oriented to person, place, and time. He appears well-nourished.  Non-toxic appearance.       Steroid facies  Eyes: EOM are normal. No scleral icterus.  Neck: Neck supple.  Cardiovascular: Regular rhythm and normal heart sounds.  Tachycardia present.   Pulmonary/Chest: Tachypnea noted. He has decreased breath sounds. He has wheezes.  Musculoskeletal: He exhibits no edema.  Neurological: He is alert and oriented to person, place, and time. Coordination normal.  Psychiatric: He has a normal mood and affect.   Nebulizer [facility] reading (PRIMARY) by  Dr.Delainy Mcelhiney clear cxr        Assessment & Plan:  SOB Bronchospasm Colostomey Insomnia  Refer to Dr. Shelle Iron to clear for surgery Alprazolam for agitation and sleep Decrease metoprolol to help brochospasm

## 2011-11-16 NOTE — Telephone Encounter (Signed)
Please pull pt's old chart for me to review.

## 2011-11-27 NOTE — Telephone Encounter (Signed)
Chart received and placed in [facility] look at for review.

## 2011-11-28 NOTE — Telephone Encounter (Signed)
After reviewing everything, I think he needs ov to be cleared for surgery.  Please arrange.

## 2011-11-29 NOTE — Telephone Encounter (Signed)
LMOMTCB x1 for pt 

## 2011-11-30 NOTE — Telephone Encounter (Signed)
LMOMTCB x 1 on mobile #. Home # does not have voice mail set up.

## 2011-12-01 NOTE — Telephone Encounter (Signed)
lmtcb

## 2011-12-05 NOTE — Telephone Encounter (Signed)
LMTCB and will sign per triage protocol  

## 2011-12-15 ENCOUNTER — Ambulatory Visit (INDEPENDENT_AMBULATORY_CARE_PROVIDER_SITE_OTHER): Payer: Managed Care, Other (non HMO) | Admitting: Internal Medicine

## 2011-12-15 VITALS — BP 130/80 | HR 121 | Temp 98.0°F | Resp 18 | Ht 69.0 in | Wt 154.0 lb

## 2011-12-15 DIAGNOSIS — M79609 Pain in unspecified limb: Secondary | ICD-10-CM

## 2011-12-15 DIAGNOSIS — I731 Thromboangiitis obliterans [Buerger's disease]: Secondary | ICD-10-CM

## 2011-12-15 DIAGNOSIS — M79646 Pain in unspecified finger(s): Secondary | ICD-10-CM

## 2011-12-15 DIAGNOSIS — E871 Hypo-osmolality and hyponatremia: Secondary | ICD-10-CM

## 2011-12-15 LAB — COMPREHENSIVE METABOLIC PANEL
AST: 24 U/L (ref 0–37)
Alkaline Phosphatase: 78 U/L (ref 39–117)
BUN: 16 mg/dL (ref 6–23)
Glucose, Bld: 82 mg/dL (ref 70–99)
Potassium: 4 mEq/L (ref 3.5–5.3)
Sodium: 134 mEq/L — ABNORMAL LOW (ref 135–145)
Total Bilirubin: 0.7 mg/dL (ref 0.3–1.2)
Total Protein: 6.5 g/dL (ref 6.0–8.3)

## 2011-12-15 LAB — LIPID PANEL
Cholesterol: 217 mg/dL — ABNORMAL HIGH (ref 0–200)
HDL: 55 mg/dL (ref >39)
Triglycerides: 229 mg/dL — ABNORMAL HIGH (ref <150)

## 2011-12-15 LAB — POCT CBC
Granulocyte percent: 73.7 %G (ref 37–80)
HCT, POC: 48.9 % (ref 43.5–53.7)
Lymph, poc: 2.2 (ref 0.6–3.4)
MCHC: 31.5 g/dL — AB (ref 31.8–35.4)
MID (cbc): 0.9 (ref 0–0.9)
POC Granulocyte: 8.8 — AB (ref 2–6.9)
POC LYMPH PERCENT: 18.9 %L (ref 10–50)
Platelet Count, POC: 301 10*3/uL (ref 142–424)
RDW, POC: 15.7 %

## 2011-12-15 MED ORDER — HYDROCODONE-ACETAMINOPHEN 7.5-500 MG PO TABS: 1.0000 | ORAL_TABLET | Freq: Four times a day (QID) | ORAL | Status: AC | PRN

## 2011-12-15 NOTE — Progress Notes (Signed)
Subjective:    Patient ID: Tyler [name], male    DOB: 1950/07/02, 61 y.o.   MRN: [id number]  HPIIn by his surgeon Dr Dimas Chyle who is planning an ileostomy repair on Monday because preop lab work revealed hyponatremia Mr. Georg is not on diuretics, has no liver disease, no heart failure, and has not been volume depleted.His last TSH was one year ago and was normal. He has no history of adrenal insufficiency. He has no history of renal failure but his last creatinine and BUN were slightly elevated. He has been on chronic steroid use. He does not complain of thirst excessive water and take or nocturia. Except for the pain in his finger which has a vascular ischemia he feels relatively good and wants to proceed with his ileostomy repair. Outside labs reveal sodium 124 and chloride of 79 with a CO2 of 34. Creatinine is 1.3 and BUN 26. Calcium was normal potassium was 3.5. Now that his anticoagulants have been discontinued his time was normal.  He was unable to sleep last night due to the pain in his affected extremity. He cannot tolerate the OxyContin prescribed by Dr. Jamas Lav because it makes him crazy. His current pain is 10 out of 10 and he would like relief. He has an appointment at [facility] for revascularization In July.     Review of Systems Patient Active Problem List  Diagnosis  . CROHN'S DISEASE  . Pain in hand  . Occlusion and stenosis of carotid artery without mention of cerebral infarction  . COPD with asthma  . HTN (hypertension)  . Nicotine addiction  . TIA (transient ischemic attack)  . Buerger disease  . H/O colectomy  . Pulmonary embolus  . Sinus tachycardia  . DOE (dyspnea on exertion)         Objective:   Physical Exam Vital signs are normal He is obviously uncomfortable HEENT clear/extraocular movements conjugate/ No thyromegaly or lymphadenopathy Heart regular without murmur Extremities without edema Right third finger with a scale distal tip and  cyanosis in the distal half. He has hyperesthesia    Procedure digital block was performed with 2% lidocaine 3 cc bilaterally/he got full relief of his pain so he was given an additional 1 cc of Marcaine on each side/he is advised to keep the extremity warm for the next 6-8 hours  Addendum=results Results for orders placed in visit on 12/15/11  POCT CBC      Component Value Range   WBC 11.9 (*) 4.6 - 10.2 K/uL   Lymph, poc 2.2  0.6 - 3.4   POC LYMPH PERCENT 18.9  10 - 50 %L   MID (cbc) 0.9  0 - 0.9   POC MID % 7.4  0 - 12 %M   POC Granulocyte 8.8 (*) 2 - 6.9   Granulocyte percent 73.7  37 - 80 %G   RBC 4.69  4.69 - 6.13 M/uL   Hemoglobin 15.4  14.1 - 18.1 g/dL   HCT, POC 03.4  74.2 - 53.7 %   MCV 104.2 (*) 80 - 97 fL   MCH, POC 32.8 (*) 27 - 31.2 pg   MCHC 31.5 (*) 31.8 - 35.4 g/dL   RDW, POC 59.5     Platelet Count, POC 301  142 - 424 K/uL   MPV 7.6  0 - 99.8 fL  COMPREHENSIVE METABOLIC PANEL      Component Value Range   Sodium 134 (*) 135 - 145 mEq/L   Potassium 4.0  3.5 - 5.3 mEq/L   Chloride 87 (*) 96 - 112 mEq/L   CO2 32  19 - 32 mEq/L   Glucose, Bld 82  70 - 99 mg/dL   BUN 16  6 - 23 mg/dL   Creat 6.04  5.40 - 9.81 mg/dL   Total Bilirubin 0.7  0.3 - 1.2 mg/dL   Alkaline Phosphatase 78  39 - 117 U/L   AST 24  0 - 37 U/L   ALT 23  0 - 53 U/L   Total Protein 6.5  6.0 - 8.3 g/dL   Albumin 4.2  3.5 - 5.2 g/dL   Calcium 19.1  8.4 - 47.8 mg/dL  TSH      Component Value Range   TSH 1.656  0.350 - 4.500 uIU/mL  CORTISOL-AM, BLOOD      Component Value Range   Cortisol - AM 22.5 (*) 4.3 - 22.4 ug/dL  OSMOLALITY      Component Value Range   Osmolality 278  275 - 300 mOsm/kg  OSMOLALITY, URINE      Component Value Range   Osmolality, Ur 360 (*) 390 - 1090 mOsm/kg  LIPID PANEL      Component Value Range   Cholesterol 217 (*) 0 - 200 mg/dL   Triglycerides 295 (*) <150 mg/dL   HDL 55  >62 mg/dL   Total CHOL/HDL Ratio 3.9     VLDL 46 (*) 0 - 40 mg/dL   LDL  Cholesterol 130 (*) 0 - 99 mg/dL      Assessment & Plan:  Problem #1 hyponatremia Labs to include serum and urine osmolality, cortisol, TSH, and triglycerides Metabolic profile will be repeated  Problem #2 pain right-handed vascular compromise DC OxyContin and oxycodone and use Vicodin ES every 6 hours as needed #60 with 1 refill      Addendum; results suggest that hyponatremia is transient and not a permanent problem and likely related to the combination of chronic steroids plus alcohol on the day before the blood was drawn I recommend going ahead with the surgery and we will send a copy to Dr.Gimenez

## 2011-12-16 LAB — OSMOLALITY: Osmolality: 278 mOsm/kg (ref 275–300)

## 2011-12-16 LAB — CORTISOL-AM, BLOOD: Cortisol - AM: 22.5 ug/dL — ABNORMAL HIGH (ref 4.3–22.4)

## 2011-12-18 DIAGNOSIS — Z0271 Encounter for disability determination: Secondary | ICD-10-CM

## 2011-12-18 HISTORY — PX: COLOSTOMY CLOSURE: SHX1381

## 2011-12-28 ENCOUNTER — Other Ambulatory Visit: Payer: Self-pay | Admitting: Physician Assistant

## 2011-12-28 ENCOUNTER — Ambulatory Visit (INDEPENDENT_AMBULATORY_CARE_PROVIDER_SITE_OTHER): Payer: Managed Care, Other (non HMO) | Admitting: Internal Medicine

## 2011-12-28 VITALS — BP 100/68 | HR 64 | Temp 97.5°F | Resp 18 | Ht 68.5 in | Wt 150.8 lb

## 2011-12-28 DIAGNOSIS — I498 Other specified cardiac arrhythmias: Secondary | ICD-10-CM

## 2011-12-28 DIAGNOSIS — J4489 Other specified chronic obstructive pulmonary disease: Secondary | ICD-10-CM

## 2011-12-28 DIAGNOSIS — L02519 Cutaneous abscess of unspecified hand: Secondary | ICD-10-CM

## 2011-12-28 DIAGNOSIS — J449 Chronic obstructive pulmonary disease, unspecified: Secondary | ICD-10-CM

## 2011-12-28 DIAGNOSIS — Z7189 Other specified counseling: Secondary | ICD-10-CM

## 2011-12-28 DIAGNOSIS — R000 Tachycardia, unspecified: Secondary | ICD-10-CM

## 2011-12-28 DIAGNOSIS — I999 Unspecified disorder of circulatory system: Secondary | ICD-10-CM

## 2011-12-28 DIAGNOSIS — L089 Local infection of the skin and subcutaneous tissue, unspecified: Secondary | ICD-10-CM

## 2011-12-28 DIAGNOSIS — I998 Other disorder of circulatory system: Secondary | ICD-10-CM

## 2011-12-28 DIAGNOSIS — G4700 Insomnia, unspecified: Secondary | ICD-10-CM

## 2011-12-28 MED ORDER — ALPRAZOLAM 0.5 MG PO TABS: 0.5000 mg | ORAL_TABLET | Freq: Two times a day (BID) | ORAL | Status: DC

## 2011-12-28 MED ORDER — AMOXICILLIN-POT CLAVULANATE 875-125 MG PO TABS: 1.0000 | ORAL_TABLET | Freq: Two times a day (BID) | ORAL | Status: AC

## 2011-12-28 NOTE — Progress Notes (Signed)
  Subjective:    Patient ID: Tyler [name], male    DOB: May 12, 1951, 61 y.o.   MRN: [id number]  HPI Very complicated hx. Repair of ileosotomy done, scheduled for revasculariztion of hand at [facility],  Copd stable and smoking less, PE 4 mos ago on xarelto, new sinus tachy persists, Buergers disease progressive, and now states cat bite to end of ischemic finger occurred  Yesterday. Hospital discharge reviewed in detail   Review of Systems See list    Objective:   Physical Exam  Constitutional: He is oriented to person, place, and time. He appears well-developed and well-nourished.  Eyes: EOM are normal.  Neck: Neck supple.  Cardiovascular: Regular rhythm and normal heart sounds.  Tachycardia present.   Pulmonary/Chest: Effort normal and breath sounds normal. No respiratory distress.  Neurological: He is alert and oriented to person, place, and time.  Skin:       Ischemic digits Cat bite no redness yet  Psychiatric: He has a normal mood and affect.          Assessment & Plan:  Augmentin 875 bidx10d RF vicodin prn need chronic ischemic pain

## 2011-12-28 NOTE — Patient Instructions (Addendum)
Animal Bite  An animal bite can result in a scratch on the skin, deep open cut, puncture of the skin, crush injury, or tearing away of the skin or a body part. Dogs are responsible for most animal bites. Children are bitten more often than adults. An animal bite can range from very mild to more serious. A small bite from your house pet is no cause for alarm. However, some animal bites can become infected or injure a bone or other tissue. You must seek medical care if:  · The skin is broken and bleeding does not slow down or stop after 15 minutes.  · The puncture is deep and difficult to clean (such as a cat bite).  · Pain, warmth, redness, or pus develops around the wound.  · The bite is from a stray animal or rodent. There may be a risk of rabies infection.  · The bite is from a snake, raccoon, skunk, fox, coyote, or bat. There may be a risk of rabies infection.  · The person bitten has a chronic illness such as diabetes, liver disease, or cancer, or the person takes medicine that lowers the immune system.  · There is concern about the location and severity of the bite.  It is important to clean and protect an animal bite wound right away to prevent infection. Follow these steps:  · Clean the wound with plenty of water and soap.  · Apply an antibiotic cream.  · Apply gentle pressure over the wound with a clean towel or gauze to slow or stop bleeding.  · Elevate the affected area above the heart to help stop any bleeding.  · Seek medical care. Getting medical care within 8 hours of the animal bite leads to the best possible outcome.  DIAGNOSIS   Your caregiver will most likely:  · Take a detailed history of the animal and the bite injury.  · Perform a wound exam.  · Take your medical history.  Blood tests or X-rays may be performed. Sometimes, infected bite wounds are cultured and sent to a lab to identify the infectious bacteria.   TREATMENT   Medical treatment will depend on the location and type of animal bite as  well as the patient's medical history. Treatment may include:  · Wound care, such as cleaning and flushing the wound with saline solution, bandaging, and elevating the affected area.  · Antibiotics.  · Tetanus immunization.  · Rabies immunization.  · Leaving the wound open to heal. This is often done with animal bites, due to the high risk of infection. However, in certain cases, wound closure with stitches, wound adhesive, skin adhesive strips, or staples may be used.   Infected bites that are left untreated may require intravenous (IV) antibiotics and surgical treatment in the hospital.  HOME CARE INSTRUCTIONS  · Follow your caregiver's instructions for wound care.  · Take all medicines as directed.  · If your caregiver prescribes antibiotics, take them as directed. Finish them even if you start to feel better.  · Follow up with your caregiver for further exams or immunizations as directed.  You may need a tetanus shot if:  · You cannot remember when you had your last tetanus shot.  · You have never had a tetanus shot.  · The injury broke your skin.  If you get a tetanus shot, your arm may swell, get red, and feel warm to the touch. This is common and not a problem. If you need a tetanus   shot and you choose not to have one, there is a rare chance of getting tetanus. Sickness from tetanus can be serious.  SEEK MEDICAL CARE IF:  · You notice warmth, redness, soreness, swelling, pus discharge, or a bad smell coming from the wound.  · You have a red line on the skin coming from the wound.  · You have a fever, chills, or a general ill feeling.  · You have nausea or vomiting.  · You have continued or worsening pain.  · You have trouble moving the injured part.  · You have other questions or concerns.  MAKE SURE YOU:  · Understand these instructions.  · Will watch your condition.  · Will get help right away if you are not doing well or get worse.  Document Released: 02/21/2011 Document Revised: 05/25/2011 Document  Reviewed: 02/21/2011  [employer]® Patient Information ©2012 [employer].

## 2011-12-31 ENCOUNTER — Other Ambulatory Visit: Payer: Self-pay | Admitting: Internal Medicine

## 2012-01-05 ENCOUNTER — Telehealth: Payer: Self-pay

## 2012-01-05 NOTE — Telephone Encounter (Signed)
PT WOULD LIKE TO COME BY AND P/U COPIES OF HIS BLOOD WORK HE HAD DONE AND ALSO A COPY OF HIS RECORDS HAVE AN APPT ON Monday SO REALLY WOULD LIKE TO SEE IF WE CAN GET THEM BY THEN PLEASE CALL [phone]

## 2012-01-05 NOTE — Telephone Encounter (Signed)
Printed all recent visits from Epic and copied records 2009-present from old chart. Left message on machine records ready for pick up.

## 2012-01-14 ENCOUNTER — Ambulatory Visit (INDEPENDENT_AMBULATORY_CARE_PROVIDER_SITE_OTHER): Payer: Managed Care, Other (non HMO) | Admitting: Internal Medicine

## 2012-01-14 VITALS — BP 100/54 | HR 120 | Temp 98.4°F | Resp 18 | Ht 68.0 in | Wt 153.0 lb

## 2012-01-14 DIAGNOSIS — R000 Tachycardia, unspecified: Secondary | ICD-10-CM

## 2012-01-14 DIAGNOSIS — J449 Chronic obstructive pulmonary disease, unspecified: Secondary | ICD-10-CM

## 2012-01-14 DIAGNOSIS — I999 Unspecified disorder of circulatory system: Secondary | ICD-10-CM

## 2012-01-14 DIAGNOSIS — M79643 Pain in unspecified hand: Secondary | ICD-10-CM

## 2012-01-14 DIAGNOSIS — M79609 Pain in unspecified limb: Secondary | ICD-10-CM

## 2012-01-14 DIAGNOSIS — K5090 Crohn's disease, unspecified, without complications: Secondary | ICD-10-CM

## 2012-01-14 DIAGNOSIS — I731 Thromboangiitis obliterans [Buerger's disease]: Secondary | ICD-10-CM

## 2012-01-14 DIAGNOSIS — G4700 Insomnia, unspecified: Secondary | ICD-10-CM | POA: Insufficient documentation

## 2012-01-14 DIAGNOSIS — I998 Other disorder of circulatory system: Secondary | ICD-10-CM | POA: Insufficient documentation

## 2012-01-14 DIAGNOSIS — I10 Essential (primary) hypertension: Secondary | ICD-10-CM

## 2012-01-14 DIAGNOSIS — I2699 Other pulmonary embolism without acute cor pulmonale: Secondary | ICD-10-CM

## 2012-01-14 MED ORDER — AMITRIPTYLINE HCL 100 MG PO TABS: 100.0000 mg | ORAL_TABLET | Freq: Every day | ORAL | Status: DC

## 2012-01-14 MED ORDER — HYDROCODONE-ACETAMINOPHEN 7.5-325 MG PO TABS: 1.0000 | ORAL_TABLET | Freq: Four times a day (QID) | ORAL | Status: AC | PRN

## 2012-01-14 NOTE — Progress Notes (Signed)
Subjective:    Patient ID: Tyler [name], male    DOB: 09-27-1950, 61 y.o.   MRN: [id number]  HPI Very discouraged at lack of getting well and lack of any aggressive treatment  Patient Active Problem List  Diagnosis  . CROHN'S DISEASE-?diag/now s/p ileost repair-Doing well from this standpoint   . Pain in hand Secondary osteoarthritis-upset over discussion re oxycontin at last OV-doesn't want this/Too groggy   . Occlusion and stenosis of carotid artery without mention of cerebral infarction  . COPD with asthma/Emphysema  . HTN (hypertension)  . Nicotine addiction-No further cigarettes/still smokes cigars  . TIA (transient ischemic attack)-2012   . Buerger disease-[facility] not being aggressive enough/put him off re repair for 6 mos cause he still smokes 4 cigars today-upset  . H/O colectomyStatus post repair  . Pulmonary embolusAnd on xarelto 3/13   . Sinus tachycardia-Unclear etiology   . DOE (dyspnea on exertion)Thought secondary to chronic obstructive pulmonary disease/emphysema   Complaining of being very cold especially at night requiring bedridden temperature of 80 or more TSH one month ago within normal limits No new medications to cause this No night sweats or fever Now off prednisone for the last 3 weeks No pain meds for one week and is very uncomfortable  Arthritis of hands his main complaint in addition to ischemic digit His wife has had recent concerns that he is slurring his speech/apparently unrelated to his medicines-he denies confusion or memory loss    Review of Systems No fever chills or night sweats No weight loss Recent increase in congestion but has stopped Spiriva/cough nonproductive No new shortness of breath/no palpitations/no chest pain/no edema Still on Septra after last visit/cat bite or scratch No belly complaints post surgery No urinary tract complaints He is fatigued but there is no recent change He appears more depressed than usual      Objective:   Physical Exam  Affect sad and discouraged but in no acute distress Filed Vitals:   01/14/12 0746  BP: 100/54 And is no longer on amlodipine, just metoprolol tartrate  Pulse: 120  Temp: 98.4 F (36.9 C)  Resp: 18   Heart regular with a rate of 115 Lungs clear except for occasional rhonchi Peripheral pulses good Ischemic digit with scab but no cellulitis PFT and DIP joints tender without redness       Assessment & Plan:  Problem #1 peripheral vascular disease Problem #2 ischemia of digit Secondary to vasculitis or peripheral vascular disease/autoimmune etiology is a consistent Diagnosis by his consultants= the vascular surgeons here and at [facility]= Dr Dierdre Searles and Dr Shawnee Knapp; rheumatology Here and at [facility]=Dr Vilma Prader He has a history of Crohn's disease since the 58s although his most recent colonoscopy with biopsy had negative findings. His hand pain has improved with prednisone although there has never been an inflammatory component identified. He did not seem to respond to Plaquenil His last sedimentation rate was normal. Problem #3 pain in hands with osteoarthritis At this point I recommend no prednisone-will allow hydrocodone 7.5/325 for pain control Recheck in one month  We need to proceed with another consultation with vascular surgery at [facility] can't resume work with his persistent ischemic pain in his digit  Problem #4 COPD/emphysema-advised to resume Spiriva Problem #5 tachycardia of unclear etiology Problem #6 cigar smoking Problem #7 insomnia-he has responded to amitriptyline, and I will increase this to 100 mg each night to see if it offers better pain control as well Problem #8 status  post TIA with aphasia and confusion 05/2011 Problem #9 pulmonary embolus 08/2011 on Xarelto  Meds ordered this encounter  Medications  . HYDROcodone-acetaminophen (NORCO) 7.5-325 MG per tablet    Sig: Take 1 tablet by mouth every 6 (six) hours as  needed for pain.    Dispense:  120 tablet    Refill:  0  . amitriptyline (ELAVIL) 100 MG tablet    Sig: Take 1 tablet (100 mg total) by mouth at bedtime.    Dispense:  30 tablet    Refill:  3

## 2012-01-18 DIAGNOSIS — Z0271 Encounter for disability determination: Secondary | ICD-10-CM

## 2012-01-25 ENCOUNTER — Ambulatory Visit (INDEPENDENT_AMBULATORY_CARE_PROVIDER_SITE_OTHER): Payer: Managed Care, Other (non HMO) | Admitting: Internal Medicine

## 2012-01-25 VITALS — BP 98/65 | HR 99 | Temp 97.6°F | Resp 20 | Ht 68.0 in | Wt 150.0 lb

## 2012-01-25 DIAGNOSIS — R21 Rash and other nonspecific skin eruption: Secondary | ICD-10-CM

## 2012-01-25 DIAGNOSIS — H5710 Ocular pain, unspecified eye: Secondary | ICD-10-CM

## 2012-01-25 DIAGNOSIS — H5713 Ocular pain, bilateral: Secondary | ICD-10-CM

## 2012-01-25 DIAGNOSIS — H1013 Acute atopic conjunctivitis, bilateral: Secondary | ICD-10-CM

## 2012-01-25 DIAGNOSIS — R5383 Other fatigue: Secondary | ICD-10-CM

## 2012-01-25 DIAGNOSIS — H1045 Other chronic allergic conjunctivitis: Secondary | ICD-10-CM

## 2012-01-25 LAB — POCT CBC
Granulocyte percent: 71.6 %G (ref 37–80)
MCH, POC: 28.9 pg (ref 27–31.2)
MCV: 95.6 fL (ref 80–97)
MID (cbc): 0.5 (ref 0–0.9)
MPV: 7.2 fL (ref 0–99.8)
POC LYMPH PERCENT: 21.1 %L (ref 10–50)
POC MID %: 7.3 %M (ref 0–12)
Platelet Count, POC: 479 10*3/uL — AB (ref 142–424)
RDW, POC: 17.4 %

## 2012-01-25 LAB — COMPREHENSIVE METABOLIC PANEL
AST: 19 U/L (ref 0–37)
Albumin: 2.3 g/dL — ABNORMAL LOW (ref 3.5–5.2)
Alkaline Phosphatase: 76 U/L (ref 39–117)
BUN: 4 mg/dL — ABNORMAL LOW (ref 6–23)
Calcium: 7.6 mg/dL — ABNORMAL LOW (ref 8.4–10.5)
Chloride: 107 mEq/L (ref 96–112)
Glucose, Bld: 89 mg/dL (ref 70–99)
Potassium: 3.4 mEq/L — ABNORMAL LOW (ref 3.5–5.3)
Sodium: 139 mEq/L (ref 135–145)
Total Protein: 4.7 g/dL — ABNORMAL LOW (ref 6.0–8.3)

## 2012-01-25 LAB — IRON AND TIBC: UIBC: 54 ug/dL — ABNORMAL LOW (ref 125–400)

## 2012-01-25 MED ORDER — TRIAMCINOLONE ACETONIDE 0.1 % EX CREA: TOPICAL_CREAM | Freq: Two times a day (BID) | CUTANEOUS | Status: DC

## 2012-01-25 MED ORDER — CETIRIZINE HCL 10 MG PO TABS: 10.0000 mg | ORAL_TABLET | Freq: Every day | ORAL | Status: DC

## 2012-01-25 MED ORDER — PREDNISONE 10 MG PO TABS: ORAL_TABLET | ORAL | Status: DC

## 2012-01-25 NOTE — Progress Notes (Signed)
  Subjective:    Patient ID: Tyler [name], male    DOB: 1950-06-28, 61 y.o.   MRN: [id number]  HPI ! Week hx of new scaly rash, see nurses note. Appears to be a drug reaction and involves his conjunctiva but not oral. No fever, feels fatigue, and decreased appetitie. Also stopped prednisone recently. Eyes are dry, mouth is dry   Review of Systems     Objective:   Physical Exam Very scaly, dry rash especially face, neck and involves eyelids. COR stable No oral sores Results for orders placed in visit on 01/25/12  POCT CBC      Component Value Range   WBC 7.3  4.6 - 10.2 K/uL   Lymph, poc 1.5  0.6 - 3.4   POC LYMPH PERCENT 21.1  10 - 50 %L   MID (cbc) 0.5  0 - 0.9   POC MID % 7.3  0 - 12 %M   POC Granulocyte 5.2  2 - 6.9   Granulocyte percent 71.6  37 - 80 %G   RBC 4.12 (*) 4.69 - 6.13 M/uL   Hemoglobin 11.9 (*) 14.1 - 18.1 g/dL   HCT, POC 78.2 (*) 95.6 - 53.7 %   MCV 95.6  80 - 97 fL   MCH, POC 28.9  27 - 31.2 pg   MCHC 30.2 (*) 31.8 - 35.4 g/dL   RDW, POC 21.3     Platelet Count, POC 479 (*) 142 - 424 K/uL   MPV 7.2  0 - 99.8 fL        Assessment & Plan:  Artificial tears/lacrilube/lemon drop oral DC rivaroxaban, probable offending agent Triamcenelon .1% cream Prednisone 10mg  bid Zyrtec/benedryl See opthalmology

## 2012-01-25 NOTE — Progress Notes (Deleted)
[facility] [address], [place] [address] [phone]  Date:  01/25/2012   Name:  WOODS GANGEMI   DOB:  February 20, 1951   MRN:  [id number]  PCP:  Tally Due, MD    Chief Complaint: Conjunctivitis   History of Present Illness:  Tyler [name] is a 61 y.o. very pleasant male patient who presents with the following:  Complex medical history  Patient Active Problem List  Diagnosis  . CROHN'S DISEASE  . Pain in hand  . Occlusion and stenosis of carotid artery without mention of cerebral infarction  . COPD with asthma  . HTN (hypertension)  . Nicotine addiction  . TIA (transient ischemic attack)  . Buerger disease  . H/O colectomy  . Pulmonary embolus  . Sinus tachycardia  . DOE (dyspnea on exertion)  . Insomnia  . Ischemia of digits of hand    Past Medical History  Diagnosis Date  . Hyperlipidemia   . Crohn disease   . Anemia   . COPD (chronic obstructive pulmonary disease)   . Hypertension   . Arthritis   . Anxiety   . Joint pain   . Pulmonary embolus 2013  . Stroke 06/14/2011  . Berger's disease   . Berger's disease     Past Surgical History  Procedure Date  . Crohn's surgery [id number]  . Appendectomy 1979  . Small intestine surgery   . Colon surgery     History  Substance Use Topics  . Smoking status: Former Smoker -- 2.0 packs/day for 35 years    Types: Cigarettes    Quit date: 07/21/2011  . Smokeless tobacco: Never Used  . Alcohol Use: Yes     drinks 2 alcohol beverages per day    Family History  Problem Relation Age of Onset  . Colon cancer      no family Hx of    No Known Allergies  Medication list has been reviewed and updated.  Current Outpatient Prescriptions on File Prior to Visit  Medication Sig Dispense Refill  . albuterol (PROVENTIL HFA;VENTOLIN HFA) 108 (90 BASE) MCG/ACT inhaler Inhale 2 puffs into the lungs every 6 (six) hours as needed. For shortness of breath  1 Inhaler  11  . ALPRAZolam  (XANAX) 0.5 MG tablet Take 1 tablet (0.5 mg total) by mouth 2 (two) times daily. Pt taking qhs prn for insomnia  60 tablet  3  . amitriptyline (ELAVIL) 100 MG tablet Take 1 tablet (100 mg total) by mouth at bedtime.  30 tablet  3  . HYDROcodone-acetaminophen (NORCO) 7.5-325 MG per tablet Take 1 tablet by mouth every 6 (six) hours as needed for pain.  120 tablet  0  . ipratropium (ATROVENT) 0.02 % nebulizer solution Take 2.5 mLs (500 mcg total) by nebulization 4 (four) times daily.  75 mL  12  . metoprolol succinate (TOPROL-XL) 50 MG 24 hr tablet TAKE 1 TABLET EVERY DAY  30 tablet  1  . mupirocin ointment (BACTROBAN) 2 % Apply 1 application topically 3 (three) times daily.      . nitroGLYCERIN (NITRODUR - DOSED IN MG/24 HR) 0.3 mg/hr Place 1 patch onto the skin daily.      . pantoprazole (PROTONIX) 40 MG tablet Take 1 tablet (40 mg total) by mouth 2 (two) times daily before a meal.  60 tablet  0  . Rivaroxaban 20 MG TABS Take 20 mg by mouth daily.  30 tablet  5  . tiotropium (SPIRIVA) 18  MCG inhalation capsule Place 1 capsule (18 mcg total) into inhaler and inhale daily.  30 capsule  5  . DISCONTD: lisinopril (PRINIVIL,ZESTRIL) 10 MG tablet Take 10 mg by mouth daily.      Marland Kitchen DISCONTD: losartan (COZAAR) 25 MG tablet Take 50 mg by mouth daily.         Review of Systems:  ***  Physical Examination: Filed Vitals:   01/25/12 0819  BP: 98/65  Pulse: 99  Temp: 97.6 F (36.4 C)  Resp: 20   Filed Vitals:   01/25/12 0819  Height: 5\' 8"  (1.727 m)  Weight: 150 lb (68.04 kg)   Body mass index is 22.81 kg/(m^2). Ideal Body Weight: Weight in (lb) to have BMI = 25: 164.1   ***  Assessment and Plan: Abbe Amsterdam, MD

## 2012-01-25 NOTE — Progress Notes (Signed)
Dry flaky itchy skin on face, scalp, ears, pain with touch  x 3 days. Left arm pigmentation appears darker than right.   Bilateral vision changes with pain x 1 week. Denies photophobia.    Wife state  mental status has been "off" walking difficulty, thinking wife was working needing to picked up, however she did not over the past few weeks, had began xanax 3 weeks prior.  Since has discontinued the xanax and has mental status has improved.    Edema bilateral ankles. Was cat bitten  And had completed medication, Augmentin.  Weight has maintained, has decreased appetite.

## 2012-01-25 NOTE — Patient Instructions (Signed)

## 2012-01-26 LAB — ANA: Anti Nuclear Antibody(ANA): NEGATIVE

## 2012-01-30 ENCOUNTER — Ambulatory Visit (INDEPENDENT_AMBULATORY_CARE_PROVIDER_SITE_OTHER): Payer: Managed Care, Other (non HMO) | Admitting: Internal Medicine

## 2012-01-30 VITALS — BP 142/76 | HR 65 | Temp 98.0°F | Resp 17 | Ht 68.0 in | Wt 150.0 lb

## 2012-01-30 DIAGNOSIS — R609 Edema, unspecified: Secondary | ICD-10-CM

## 2012-01-30 DIAGNOSIS — R630 Anorexia: Secondary | ICD-10-CM

## 2012-01-30 DIAGNOSIS — R600 Localized edema: Secondary | ICD-10-CM

## 2012-01-30 DIAGNOSIS — T7840XA Allergy, unspecified, initial encounter: Secondary | ICD-10-CM

## 2012-01-30 DIAGNOSIS — N028 Recurrent and persistent hematuria with other morphologic changes: Secondary | ICD-10-CM

## 2012-01-30 DIAGNOSIS — E46 Unspecified protein-calorie malnutrition: Secondary | ICD-10-CM

## 2012-01-30 DIAGNOSIS — N059 Unspecified nephritic syndrome with unspecified morphologic changes: Secondary | ICD-10-CM

## 2012-01-30 DIAGNOSIS — Z888 Allergy status to other drugs, medicaments and biological substances status: Secondary | ICD-10-CM

## 2012-01-30 NOTE — Telephone Encounter (Signed)
WANDA WOULD LIKE A CALL BACK REGARDING HER HUSBAND'S BLOOD WORK. PLEASE CALL [phone]

## 2012-01-30 NOTE — Telephone Encounter (Signed)
Patient called back, shortly after Britta Mccreedy hung up from phone conversation with wife. Patient advised of labs, and also he is advised to come in for evaluation because he is having swelling of his lower extremities. He advised he will come in this afternoon to see Dr Perrin Maltese.

## 2012-01-30 NOTE — Progress Notes (Signed)
  Subjective:    Patient ID: Tyler [name], male    DOB: 09-Sep-1950, 61 y.o.   MRN: [id number]  HPI Patient has complaints of lower extremity swelling and is here for follow up of his labs. His labs indicate he is malnourished. Patient is having allergic reaction, still not sure what is causing allergy. Patients kidney functions are okay but his protein and iron levels are low. Recently patient has had decreased appetite and is forcing himself to get out of bed, he is extremely fatigued. Patient has recently been let go from his employment due to medical situation and is going to file for disability even though he would rather be working. Patient reports he lost his prednisone Rx and did not take for several days, he is going to take these again. Patients wife states he has had vomiting and loose stools recently following the bowel resection.   Review of Systems     Objective:   Physical Exam        Assessment & Plan:  Patient will see dermatology for rash probably a drug reaction(exfoliating rash) and also see the gastroenterology specialist at [facility]. He is encouraged to try to decrease his prednisone try to take 1/2 to one twice daily. He has refills on this medication.

## 2012-01-30 NOTE — Patient Instructions (Addendum)
Drink ensure/ if you can not tolerate the regular ensure, try ensure clear. Contact your surgeon about drainage from wound. You need to see gastroenterology, we will call with this appointment, also we will call with the dermatology appt for you.

## 2012-01-30 NOTE — Progress Notes (Signed)
  Subjective:    Patient ID: Tyler [name], male    DOB: Oct 17, 1950, 61 y.o.   MRN: [id number]  HPI Is recovering from drug reaction most likely to rivaroxaban. Skin has stopped peeling, conjuctiva is recovering. He is anorexic, has new 2+ ankle edema and his lab work suggests significant malnutrition. He is status post partial bowel resection with recent reanastomosis. His bergers finger ischemia is improved, however he seems unable to function without prednisone.   Review of Systems     Objective:   Physical Exam Improved Skin is healing Heart is his usual 100 bpm ekg confirms sinus tachycardia and nad. Abdomen is not tender, has still draining colostomy repair and cannot eat. Neuro intact Fingers healing       Assessment & Plan:  Anorexia/malnutrition/Edema RF to GI and dermatology Must have surgeon reasses. Start oral ensure

## 2012-01-30 NOTE — Telephone Encounter (Signed)
Spoke w/pt's wife and explained labs and instr's. Wife stated that w/pt's bowel resection she is not sure if pt can take iron. Wife reported that pt has had pitting edema in legs, esp bad this morning even when he first got up. instr'd wife that pt needs to RTC for eval and gave her Dr Ernestene Mention hours today. Wife agreed.

## 2012-02-12 ENCOUNTER — Other Ambulatory Visit: Payer: Self-pay | Admitting: Dermatology

## 2012-02-13 ENCOUNTER — Encounter: Payer: Self-pay | Admitting: Internal Medicine

## 2012-02-17 ENCOUNTER — Ambulatory Visit (INDEPENDENT_AMBULATORY_CARE_PROVIDER_SITE_OTHER): Payer: Managed Care, Other (non HMO) | Admitting: Internal Medicine

## 2012-02-17 VITALS — BP 133/90 | HR 98 | Temp 97.7°F | Resp 18 | Ht 68.0 in | Wt 142.0 lb

## 2012-02-17 DIAGNOSIS — D649 Anemia, unspecified: Secondary | ICD-10-CM

## 2012-02-17 DIAGNOSIS — R202 Paresthesia of skin: Secondary | ICD-10-CM

## 2012-02-17 DIAGNOSIS — G4700 Insomnia, unspecified: Secondary | ICD-10-CM

## 2012-02-17 DIAGNOSIS — R21 Rash and other nonspecific skin eruption: Secondary | ICD-10-CM

## 2012-02-17 DIAGNOSIS — R6889 Other general symptoms and signs: Secondary | ICD-10-CM

## 2012-02-17 DIAGNOSIS — R609 Edema, unspecified: Secondary | ICD-10-CM

## 2012-02-17 DIAGNOSIS — I998 Other disorder of circulatory system: Secondary | ICD-10-CM

## 2012-02-17 DIAGNOSIS — M79643 Pain in unspecified hand: Secondary | ICD-10-CM

## 2012-02-17 DIAGNOSIS — R634 Abnormal weight loss: Secondary | ICD-10-CM

## 2012-02-17 DIAGNOSIS — R630 Anorexia: Secondary | ICD-10-CM

## 2012-02-17 DIAGNOSIS — R5383 Other fatigue: Secondary | ICD-10-CM

## 2012-02-17 LAB — FOLATE: Folate: 6.7 ng/mL

## 2012-02-17 LAB — COMPREHENSIVE METABOLIC PANEL
ALT: 21 U/L (ref 0–53)
AST: 26 U/L (ref 0–37)
Albumin: 3 g/dL — ABNORMAL LOW (ref 3.5–5.2)
BUN: 6 mg/dL (ref 6–23)
Calcium: 8.1 mg/dL — ABNORMAL LOW (ref 8.4–10.5)
Chloride: 106 mEq/L (ref 96–112)
Potassium: 3.3 mEq/L — ABNORMAL LOW (ref 3.5–5.3)
Sodium: 143 mEq/L (ref 135–145)
Total Protein: 5.3 g/dL — ABNORMAL LOW (ref 6.0–8.3)

## 2012-02-17 LAB — POCT CBC
Granulocyte percent: 74 %G (ref 37–80)
HCT, POC: 41.6 % — AB (ref 43.5–53.7)
Hemoglobin: 12.9 g/dL — AB (ref 14.1–18.1)
MCV: 98.1 fL — AB (ref 80–97)
RBC: 4.24 M/uL — AB (ref 4.69–6.13)

## 2012-02-17 LAB — TSH: TSH: 2.504 u[IU]/mL (ref 0.350–4.500)

## 2012-02-17 LAB — VITAMIN B12: Vitamin B-12: 437 pg/mL (ref 211–911)

## 2012-02-17 MED ORDER — TRIAMCINOLONE ACETONIDE 0.1 % EX CREA: TOPICAL_CREAM | Freq: Two times a day (BID) | CUTANEOUS | Status: DC

## 2012-02-17 MED ORDER — HYDROCODONE-ACETAMINOPHEN 7.5-325 MG PO TABS: 1.0000 | ORAL_TABLET | Freq: Four times a day (QID) | ORAL | Status: AC | PRN

## 2012-02-17 NOTE — Progress Notes (Signed)
  Subjective:    Patient ID: Tyler [name], male    DOB: 01/26/1951, 61 y.o.   MRN: [id number]  HPI    Review of Systems     Objective:   Physical Exam  Procedure: Consent obtained,  MC block to R 3rd digit with 3cc of Marcaine:1% lido 75:25 mixture.      Assessment & Plan:

## 2012-02-17 NOTE — Progress Notes (Signed)
Subjective:    Patient ID: Tyler [name], male    DOB: September 05, 1950, 61 y.o.   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 Terri Piedra Paresthesias of feet with burning pain especially at night for 6-8 weeks not affecting gait Extreme cold intolerance/has to run to heat or running in the middle of the summer Continued edema Memory loss/forgetful according to wife He just feels horrible and wonders if this must be put being about to die feels like  Patient Active Problem List  Diagnosis  . CROHN'S DISEASE--Has been back to the surgeon who thinks he may have a wound infection  - Referred by Dr Perrin Maltese to Dr Terri Piedra for an exfoliative rash-biopsy suggested a virus or drug reactions etiology and although he had been on Rivaroxaban for Several months this may be the most likely etiology.   . Occlusion and stenosis of carotid artery without mention of cerebral infarction  . COPD with asthma  . HTN (hypertension)  . Nicotine addiction  . TIA (transient ischemic attack)  . Buerger disease--Has now been seen at [facility] by Dr.WA Jacolyn Reedy and started on Pletal/They're waiting record review of [facility] vascular studies [facility]  . H/O colectomy  . Pulmonary embolus  . Sinus tachycardia  . DOE (dyspnea on exertion)  . Insomnia  . Ischemia of digits of hand  He had been off steroids for a few weeks Before they were restarted by dermatology Has been on steroids for many years to control joint pains    Review of Systems Occasional nausea/no vomiting/ No genitourinary symptoms    Objective:   Physical Exam Filed Vitals:   02/17/12 1147  BP: 133/90  Pulse: 98  Temp:  97.7 F (36.5 C)  Resp: 18   In mild distress Ecchymoses left arm Skin condition has resolved except for dryness Right third finger with ischemic tip, still very painful No thyromegaly or lymphadenopathy Heart regular without murmur Lungs clear Extremities with trace edema/much improved There are no swollen joints Deep tendon reflexes symmetrical and No definite sensory losses   Results for orders placed in visit on 02/17/12  POCT CBC      Component Value Range   WBC 14.5 (*) 4.6 - 10.2 K/uL   Lymph, poc 2.7  0.6 - 3.4   POC LYMPH PERCENT 18.6  10 - 50 %L   MID (cbc) 1.1 (*) 0 - 0.9   POC MID % 7.4  0 - 12 %M   POC Granulocyte 10.7 (*) 2 - 6.9   Granulocyte percent 74.0  37 - 80 %G   RBC 4.24 (*) 4.69 - 6.13 M/uL   Hemoglobin 12.9 (*) 14.1 - 18.1 g/dL   HCT, POC 40.9 (*) 81.1 - 53.7 %   MCV 98.1 (*) 80 - 97 fL   MCH, POC 30.4  27 - 31.2 pg   MCHC 31.0 (*) 31.8 - 35.4 g/dL   RDW, POC 91.4     Platelet Count, POC 332  142 - 424 K/uL   MPV 6.8  0 - 99.8 fL        Assessment & Plan:  Problem #1 ischemic digit with pain Digital block with Marcaine and lidocaine Refill hydrocodone  Problem #2 Recent desquamation  disorder Continue Kenalog  Problem #3 underlying vascular disease Followup [place]  Problem #4 complaints related to poor calorie intake and chronic illnesses= Weight loss, edema, loss of appetite, cold intolerance  Problem #5 recent iron deficiency anemia Improving/Past history of inability to tolerate iron by mouth  Problem #6 new onset of distal paresthesias in feet Check B12 and folate/recheck total protein/recheck calcium  Problem #7 questionable memory changes/? Depression secondary to multiple illnesses  45 minutes spent with he And his wife He will need disability extension

## 2012-02-19 ENCOUNTER — Encounter: Payer: Self-pay | Admitting: Internal Medicine

## 2012-02-20 ENCOUNTER — Telehealth: Payer: Self-pay

## 2012-02-20 NOTE — Telephone Encounter (Signed)
PT STATES DR DOOLITTLE WAS GOING TO EXTEND SOME FORMS FOR HIM AND HE WANTED TO KNOW THE STATUS. PLEASE CALL [phone]

## 2012-02-21 NOTE — Telephone Encounter (Signed)
Ok to extend based on Dr. Netta Corrigan note. Please get a copy of new forms for completion.

## 2012-02-21 NOTE — Telephone Encounter (Signed)
Pt's chart [id number] w/latest disability papers from 02/01/12 and letter written by Dr Merla Riches on 02/17/12 is in phone message stack at [facility] desk. Can we extend pt's Disability papers for Dr Merla Riches since he is out of office?

## 2012-02-22 ENCOUNTER — Other Ambulatory Visit: Payer: Self-pay | Admitting: Internal Medicine

## 2012-02-22 NOTE — Telephone Encounter (Signed)
I have called patient to advise, left message. He needs to bring forms in so they can be completed for him.

## 2012-02-29 ENCOUNTER — Telehealth: Payer: Self-pay

## 2012-02-29 ENCOUNTER — Encounter: Payer: Self-pay | Admitting: Radiology

## 2012-02-29 NOTE — Telephone Encounter (Signed)
Spoke w/pharmacist who reported pt was given RFs of Amlodipine 10 on 07/01/11 #30 plus 5RFs and has been picking up RFs.  Dr Perrin Maltese, I wanted to make sure you still want pt on this. It is not on pt's current med list from last several OVs. At the 01/14/12 OV it shows up on the [facility] meds list. Pt has had so many serious health issues since last Rx written, I wanted to check w/you first. Please advise. His chart [id number] is by your computer if you need it.

## 2012-02-29 NOTE — Telephone Encounter (Signed)
[facility] is looking for a response to the request for amlodpin for this patient.  They sent the request via surescripts and fax four days ago.   [phone]

## 2012-02-29 NOTE — Telephone Encounter (Signed)
Pt CB and agreed to RTC to see Dr Perrin Maltese this weekend. He stated he has an outpt surgery on his stomach tomorrow, but assuming he is recovered from that, he will be here and bring his medications.

## 2012-02-29 NOTE — Telephone Encounter (Signed)
Dr Perrin Maltese would like pt to RTC to see him tomorrow or over the weekend and bring all of his current meds w/him.  Called [facility] back and told them not to RF amlodipine at this time until Dr Perrin Maltese reviews w/pt.  Tried to call pt/wife at [facility] and cell #s and no ans/no VM.

## 2012-03-01 HISTORY — PX: STOMACH SURGERY: SHX791

## 2012-03-02 ENCOUNTER — Ambulatory Visit (INDEPENDENT_AMBULATORY_CARE_PROVIDER_SITE_OTHER): Payer: Managed Care, Other (non HMO) | Admitting: Internal Medicine

## 2012-03-02 ENCOUNTER — Other Ambulatory Visit: Payer: Self-pay | Admitting: Physician Assistant

## 2012-03-02 VITALS — BP 136/74 | HR 108 | Temp 98.0°F | Resp 20 | Ht 68.75 in | Wt 154.6 lb

## 2012-03-02 DIAGNOSIS — M259 Joint disorder, unspecified: Secondary | ICD-10-CM

## 2012-03-02 DIAGNOSIS — R000 Tachycardia, unspecified: Secondary | ICD-10-CM

## 2012-03-02 DIAGNOSIS — I999 Unspecified disorder of circulatory system: Secondary | ICD-10-CM

## 2012-03-02 DIAGNOSIS — I998 Other disorder of circulatory system: Secondary | ICD-10-CM

## 2012-03-02 DIAGNOSIS — I498 Other specified cardiac arrhythmias: Secondary | ICD-10-CM

## 2012-03-02 DIAGNOSIS — Z7189 Other specified counseling: Secondary | ICD-10-CM

## 2012-03-02 DIAGNOSIS — I10 Essential (primary) hypertension: Secondary | ICD-10-CM

## 2012-03-02 DIAGNOSIS — M2550 Pain in unspecified joint: Secondary | ICD-10-CM

## 2012-03-02 DIAGNOSIS — J449 Chronic obstructive pulmonary disease, unspecified: Secondary | ICD-10-CM

## 2012-03-02 LAB — POCT CBC
Granulocyte percent: 82.1 %G — AB (ref 37–80)
HCT, POC: 35.5 % — AB (ref 43.5–53.7)
Hemoglobin: 10.3 g/dL — AB (ref 14.1–18.1)
Lymph, poc: 1.2 (ref 0.6–3.4)
MCHC: 29 g/dL — AB (ref 31.8–35.4)
MCV: 99.3 fL — AB (ref 80–97)
POC LYMPH PERCENT: 11.3 %L (ref 10–50)
RDW, POC: 18.1 %

## 2012-03-02 LAB — BASIC METABOLIC PANEL
BUN: 9 mg/dL (ref 6–23)
CO2: 23 mEq/L (ref 19–32)
Glucose, Bld: 65 mg/dL — ABNORMAL LOW (ref 70–99)
Potassium: 4 mEq/L (ref 3.5–5.3)

## 2012-03-02 MED ORDER — AMLODIPINE BESYLATE 10 MG PO TABS: 10.0000 mg | ORAL_TABLET | Freq: Every day | ORAL | Status: DC

## 2012-03-02 NOTE — Progress Notes (Signed)
  Subjective:    Patient ID: Tyler [name], male    DOB: [birth year], 61 y.o.   MRN: [id number]  HPI Improving slowly. Home bps 150-160/80-100 Bergers improving new [facility] vascular specialist Post surgery colostomy repair repaired again, draining and open heal inside out. COPD stable Sinus tachycardia Hypokalemia HTN improved Petechiae new on pletal will check platelets Had serious skin drug reaction to rivaroxaban Has gained 12 lbs Review of Systems complex    Objective:   Physical Exam  Constitutional: He is oriented to person, place, and time. He appears well-nourished. He appears cachectic. He is active. No distress.  HENT:  Mouth/Throat: Oropharynx is clear and moist.  Eyes: EOM are normal. No scleral icterus.  Neck: Normal range of motion.  Cardiovascular: Regular rhythm, S1 normal and S2 normal.  Tachycardia present.   Pulmonary/Chest: Effort normal and breath sounds normal.  Abdominal: There is tenderness. There is no rebound.         Open wound   Musculoskeletal: He exhibits edema.  Neurological: He is alert and oriented to person, place, and time.  Skin: Skin is warm, dry and intact. Ecchymosis and petechiae noted. He is not diaphoretic.          Petechiae arms  Psychiatric: He has a normal mood and affect.   132/82 Cbc/bmet Results for orders placed in visit on 03/02/12  POCT CBC      Component Value Range   WBC 11.0 (*) 4.6 - 10.2 K/uL   Lymph, poc 1.2  0.6 - 3.4   POC LYMPH PERCENT 11.3  10 - 50 %L   MID (cbc) 0.7  0 - 0.9   POC MID % 6.6  0 - 12 %M   POC Granulocyte 9.0 (*) 2 - 6.9   Granulocyte percent 82.1 (*) 37 - 80 %G   RBC 3.57 (*) 4.69 - 6.13 M/uL   Hemoglobin 10.3 (*) 14.1 - 18.1 g/dL   HCT, POC 86.5 (*) 78.4 - 53.7 %   MCV 99.3 (*) 80 - 97 fL   MCH, POC 28.9  27 - 31.2 pg   MCHC 29.0 (*) 31.8 - 35.4 g/dL   RDW, POC 69.6     Platelet Count, POC 322  142 - 424 K/uL   MPV 6.3  0 - 99.8 fL        Assessment & Plan:  RF meds 1 yr RTC  1 month Eat 2 bananas/day

## 2012-03-04 ENCOUNTER — Ambulatory Visit: Payer: Managed Care, Other (non HMO) | Admitting: Gastroenterology

## 2012-03-11 ENCOUNTER — Encounter: Payer: Self-pay | Admitting: Gastroenterology

## 2012-03-11 ENCOUNTER — Ambulatory Visit (INDEPENDENT_AMBULATORY_CARE_PROVIDER_SITE_OTHER): Payer: Managed Care, Other (non HMO) | Admitting: Gastroenterology

## 2012-03-11 VITALS — BP 118/60 | HR 120 | Ht 68.0 in | Wt 154.5 lb

## 2012-03-11 DIAGNOSIS — K5090 Crohn's disease, unspecified, without complications: Secondary | ICD-10-CM

## 2012-03-11 NOTE — Patient Instructions (Addendum)
One of your biggest health concerns is your smoking.  This increases your risk for most cancers and serious cardiovascular diseases such as strokes, heart attacks.  You should try your best to stop.  If you need assistance, please contact your PCP or Smoking Cessation Class at [facility] [phone]) or [facility] Quit-Line (1-800-QUIT-NOW). We will get records from Dr. Bettey Mare surgeon in [place] (including op reports, pathology reports, most recent office note as well.  Has had three bowel surgeries in 2013 and we have none of those records). Return to see Dr. Christella Hartigan in 3-4 weeks after review of those records.

## 2012-03-11 NOTE — Progress Notes (Signed)
Review of gastrointestinal problems:  1. Crohn's ileitis; diagnosed 1979, remote ileocecectomy. Colonoscopy December 2010 (sent in for routine screening) found strictured ileocecal anastomosis, biopsies showed fibrosis without active inflammation. He has intermittent postprandial obstructive type pains that are not very bothersome to him, December 2011 upper GI with small bowel follow-through showed stricture at neo TI. Labs December 2010: sedimentation rate normal, TPMT phenotyping was normal.  February 2011 started on azathiaprine for ? IBD related joint pains.  He failed to follow up until late 2013 after that.   HPI: This is a   very pleasant 61 year old man whom I last saw about 2-1/2 years ago. At that point I started him on azathioprine for possible IBD related joint pains. He tells me he took the azathioprine for one month and then stopped and I have not heard from him since.  He took azathiaprine for one month.    Had bowel surgery February, 2013 "cut a couple feet of bowel out."  This was in [place], emergent surgery for what sounds like perforation.  Temporary colostomy placed and then this was taken down December 18 2011.  Had another surgery just 10 days ago and this has been healing by secondary intention, packing once daily.  His surgeon is Dr. Alois Cliche. Works out of [employer].  He was told to see me by his PCP, Dr. Perrin Maltese.  Patient is not sure why.  Has been seeing many specialists.  He has a lot of diarrhea, 2-3 times per day.  Non-bloody.   He has abd pains post operative.   Past Medical History  Diagnosis Date  . Hyperlipidemia   . Crohn disease   . Anemia   . COPD (chronic obstructive pulmonary disease)   . Hypertension   . Arthritis   . Anxiety   . Joint pain   . Pulmonary embolus 2013  . Stroke 06/14/2011  . Thromboangiitis obliterans (Buerger's disease)     Past Surgical History  Procedure Date  . Crohn's surgery [id number]  . Appendectomy 1979  . Small  intestine surgery   . Colon surgery   . Stomach surgery 03/01/12  . Colostomy closure 12/18/11    Current Outpatient Prescriptions  Medication Sig Dispense Refill  . albuterol (PROVENTIL HFA;VENTOLIN HFA) 108 (90 BASE) MCG/ACT inhaler Inhale 2 puffs into the lungs every 6 (six) hours as needed. For shortness of breath  1 Inhaler  11  . amitriptyline (ELAVIL) 100 MG tablet Take 1 tablet (100 mg total) by mouth at bedtime.  30 tablet  3  . amLODipine (NORVASC) 10 MG tablet Take 1 tablet (10 mg total) by mouth daily.  90 tablet  3  . cetirizine (ZYRTEC) 10 MG tablet Take 1 tablet (10 mg total) by mouth daily.  30 tablet  11  . cilostazol (PLETAL) 100 MG tablet Take 100 mg by mouth 2 (two) times daily.      . hydrOXYzine (VISTARIL) 100 MG capsule Take 100 mg by mouth 3 (three) times daily as needed.      Marland Kitchen ipratropium (ATROVENT) 0.02 % nebulizer solution Take 500 mcg by nebulization 4 (four) times daily as needed.      . metoprolol succinate (TOPROL-XL) 50 MG 24 hr tablet TAKE 1 TABLET BY MOUTH EVERY DAY  90 tablet  0  . nitroGLYCERIN (NITRODUR - DOSED IN MG/24 HR) 0.3 mg/hr Place 1 patch onto the skin daily.      . predniSONE (DELTASONE) 10 MG tablet 1 po bid pc for drug reaction  21 tablet  2  . Rivaroxaban 20 MG TABS Take 20 mg by mouth daily.  30 tablet  5  . tiotropium (SPIRIVA) 18 MCG inhalation capsule Place 1 capsule (18 mcg total) into inhaler and inhale daily.  30 capsule  5  . triamcinolone cream (KENALOG) 0.1 % Apply 1 application topically 2 (two) times daily. Stop using on face after 5 days      . triamcinolone cream (KENALOG) 0.1 % Apply topically 2 (two) times daily. Stop using on face after 5 days  45 g  0  . DISCONTD: lisinopril (PRINIVIL,ZESTRIL) 10 MG tablet Take 10 mg by mouth daily.      Marland Kitchen DISCONTD: losartan (COZAAR) 25 MG tablet Take 50 mg by mouth daily.         Allergies as of 03/11/2012 - Review Complete 03/11/2012  Allergen Reaction Noted  . Rivaroxaban  02/17/2012     Family History  Problem Relation Age of Onset  . Colon cancer      no family Hx of  . Cancer Father     meta  . Clotting disorder Mother     blood clot  . Diabetes Sister     x2    History   Social History  . Marital Status: Married    Spouse Name: N/A    Number of Children: 1  . Years of Education: N/A   Occupational History  . Holiday representative   .  [employer]  . SERVICE TECHNICIAN    Social History Main Topics  . Smoking status: Current Every Day Smoker -- 5.0 packs/day for 35 years    Types: Cigarettes, Cigars  . Smokeless tobacco: Never Used  . Alcohol Use: Yes     drinks 2 alcohol beverages per day  . Drug Use: No  . Sexually Active: Not on file   Other Topics Concern  . Not on file   Social History Narrative   Married, has one daughter, works Holiday representative, former smokers, drinks 2 alcoholic beverages per day, and drinks 3 caffeine beverages per day.      Physical Exam: Ht 5\' 8"  (1.727 m)  Wt 154 lb 8 oz (70.081 kg)  BMI 23.49 kg/m2 Constitutional: generally well-appearing Psychiatric: alert and oriented x3 Abdomen: soft, midline incision, gauze being packed within a fairly healthy-appearing wound., no obvious ascites, no peritoneal signs, normal bowel sounds     Assessment and plan: 61 y.o. male with history of Crohn's disease  He has had 3 abdominal surgeries in the past 7-8 months and I have none of those records. He currently has an abdominal wound that is healing with secondary intention and packing. This appears healthy. He was really asking about his finger and what I can do to help his finger and I explained to them that I am his gastroenterologist and I'm not sure that his history of Crohn's disease in his vascular problem had anything to do with each other. I'm going to get records from his surgeon in [place] sent here and the patient will return to see me in 3-4 weeks. He asked about steroids for his finger as well as pain medicines  for his finger and I'm not going to give him those

## 2012-03-18 ENCOUNTER — Ambulatory Visit (INDEPENDENT_AMBULATORY_CARE_PROVIDER_SITE_OTHER): Payer: Managed Care, Other (non HMO) | Admitting: Internal Medicine

## 2012-03-18 VITALS — BP 162/116 | HR 123 | Temp 98.1°F | Resp 20 | Ht 68.5 in | Wt 147.4 lb

## 2012-03-18 DIAGNOSIS — M2550 Pain in unspecified joint: Secondary | ICD-10-CM

## 2012-03-18 DIAGNOSIS — R1110 Vomiting, unspecified: Secondary | ICD-10-CM

## 2012-03-18 DIAGNOSIS — R5383 Other fatigue: Secondary | ICD-10-CM

## 2012-03-18 DIAGNOSIS — G4700 Insomnia, unspecified: Secondary | ICD-10-CM

## 2012-03-18 MED ORDER — METHYLPREDNISOLONE ACETATE 80 MG/ML IJ SUSP
80.0000 mg | Freq: Once | INTRAMUSCULAR | Status: AC
  Administered 2012-03-18: 80 mg via INTRAMUSCULAR

## 2012-03-18 MED ORDER — ONDANSETRON 4 MG PO TBDP
8.0000 mg | ORAL_TABLET | Freq: Once | ORAL | Status: AC
  Administered 2012-03-18: 8 mg via ORAL

## 2012-03-18 MED ORDER — HYDROCODONE-ACETAMINOPHEN 7.5-325 MG PO TABS: 1.0000 | ORAL_TABLET | Freq: Four times a day (QID) | ORAL | Status: DC | PRN

## 2012-03-18 MED ORDER — TEMAZEPAM 30 MG PO CAPS: 30.0000 mg | ORAL_CAPSULE | Freq: Every evening | ORAL | Status: DC | PRN

## 2012-03-18 MED ORDER — ONDANSETRON HCL 4 MG PO TABS: 4.0000 mg | ORAL_TABLET | Freq: Three times a day (TID) | ORAL | Status: DC | PRN

## 2012-03-18 NOTE — Progress Notes (Signed)
  Subjective:    Patient ID: Tyler [name], male    DOB: 08-19-1950, 61 y.o.   MRN: [id number]  HPI Here with his wife and they're both very discouraged about his inability to improve He is very discouraged and somewhat depressed about his multiple maladies   Went to see [facility] that Dr. Perrin Maltese referred him to.finger much improved w/ pletal    Has had another surgery to clean infected abd wound from recent GI surgery-   He is congested, coughing and has vomiting and diarrheafor last 3 days.  Bed was soaked last night when he woke up.  Has had diarrhea four to five times today and has thrown up four to five times as well. Feels better tween episodes. No abd pain/   Feels terrible in general for weeks Fatigue all the time Hurts all over-knees/feet/arms/back Can't sleep mostly due to joint discomfort Depressed that he can't work, can't have a life  Feet wake him with burning pain  Finger much improved and happy about that/Continues to try to decrease smoking  Smoking less Review of Systems No GU symptoms    Objective:   Physical Exam Filed Vitals:   03/18/12 1806  BP: 162/116  Pulse: 123  Temp: 98.1 F (36.7 C)  Resp: 20   Nares clear rhinorrhea/throat clear No nodes Chest with scattered rhonchi that clear with coughing/No wheezing with forced expiration Heart regular with a rate 110 consistent with past tachycardia/no gallop Large dressing on the abdominal wound/abdomen nontender Extremities with no sensory loss /Joints of hands and feet tender/shoulder movement tender/  zofran 8 given w/ good response over 30 min       Assessment & Plan:  Problem #1 acute gastroenteritis  Clear liquid diet and advance slowly  Prescription for Zofran  Recheck in 48-72 hours if not well Problem #2 wound infection now responding to treatment  To continue cleaning at home Problem #3 nicotine addiction  Cessation stressed Problem #4 insomnia Problem #5 situational  depression Problem #6 Fatigue  At recent visit he was checked for adrenal insufficiency/although this may be a symptom of his  Depression, His long use of steroids may be contributing Problem #7 extensive peripheral vascular disease  Meds ordered this encounter  Medications  . ondansetron (ZOFRAN-ODT) disintegrating tablet 8 mg    Sig: prn q6-8h  . methylPREDNISolone acetate (DEPO-MEDROL) injection 80 mg      . HYDROcodone-acetaminophen (NORCO) 7.5-325 MG per tablet    Sig: Take 1 tablet by mouth every 6 (six) hours as needed for pain.    Dispense:  120 tablet    Refill:  0  . temazepam (RESTORIL) 30 MG capsule    Sig: Take 1 capsule (30 mg total) by mouth at bedtime as needed for sleep.    Dispense:  30 capsule    Refill:  0  . ondansetron (ZOFRAN) 4 MG tablet    Sig: Take 1 tablet (4 mg total) by mouth every 8 (eight) hours as needed for nausea.    Dispense:  20 tablet    Refill:  0   Followup in 2-4 days if not better/followup in 2 weeks to recheck multiple problems, Response to Depo-Medrol, consideration for antidepressants

## 2012-03-29 ENCOUNTER — Telehealth: Payer: Self-pay

## 2012-03-29 NOTE — Telephone Encounter (Signed)
Message copied by Vicenta Dunning on Fri Mar 29, 2012  9:58 AM ------      Message from: Tonye Pearson      Created: Wed Mar 20, 2012  8:31 AM       Please call to check on his vomiting and diarrhea to see if he responds to treatment/if not we need to see him again this week/advised him also to followup in one to 2 weeks to discuss his other medical problems and see if he has improved with his shot of Depo-Medrol and the addition of temazepam

## 2012-03-29 NOTE — Telephone Encounter (Signed)
Pt has appt set up for f/up on 04/12/12. Tried to call pt to check status and neither phone had VM set up - couldn't leave VM.

## 2012-03-29 NOTE — Progress Notes (Signed)
Pt has appt set up for f/up on 04/12/12. Tried to reach pt to check on his status and neither phone had VM set up. Could not LM.

## 2012-03-31 ENCOUNTER — Ambulatory Visit (INDEPENDENT_AMBULATORY_CARE_PROVIDER_SITE_OTHER): Payer: Managed Care, Other (non HMO) | Admitting: Internal Medicine

## 2012-03-31 VITALS — BP 139/83 | HR 98 | Temp 97.4°F | Resp 16 | Ht 68.0 in | Wt 152.0 lb

## 2012-03-31 DIAGNOSIS — I999 Unspecified disorder of circulatory system: Secondary | ICD-10-CM

## 2012-03-31 DIAGNOSIS — I731 Thromboangiitis obliterans [Buerger's disease]: Secondary | ICD-10-CM

## 2012-03-31 DIAGNOSIS — R202 Paresthesia of skin: Secondary | ICD-10-CM

## 2012-03-31 DIAGNOSIS — I998 Other disorder of circulatory system: Secondary | ICD-10-CM

## 2012-03-31 DIAGNOSIS — M79609 Pain in unspecified limb: Secondary | ICD-10-CM

## 2012-03-31 DIAGNOSIS — J449 Chronic obstructive pulmonary disease, unspecified: Secondary | ICD-10-CM

## 2012-03-31 DIAGNOSIS — F17200 Nicotine dependence, unspecified, uncomplicated: Secondary | ICD-10-CM

## 2012-03-31 DIAGNOSIS — R609 Edema, unspecified: Secondary | ICD-10-CM

## 2012-03-31 DIAGNOSIS — M79643 Pain in unspecified hand: Secondary | ICD-10-CM

## 2012-03-31 DIAGNOSIS — M2550 Pain in unspecified joint: Secondary | ICD-10-CM | POA: Insufficient documentation

## 2012-03-31 MED ORDER — METHYLPREDNISOLONE ACETATE 80 MG/ML IJ SUSP
80.0000 mg | Freq: Once | INTRAMUSCULAR | Status: AC
  Administered 2012-03-31: 80 mg via INTRAMUSCULAR

## 2012-03-31 MED ORDER — TIOTROPIUM BROMIDE MONOHYDRATE 18 MCG IN CAPS: 18.0000 ug | ORAL_CAPSULE | Freq: Every day | RESPIRATORY_TRACT | Status: DC

## 2012-03-31 MED ORDER — OXYCODONE-ACETAMINOPHEN 5-325 MG PO TABS: ORAL_TABLET | ORAL | Status: DC

## 2012-03-31 NOTE — Progress Notes (Signed)
[facility] [address], [place] [address] [phone]  Date:  03/31/2012   Name:  Tyler [name]   DOB:  22-Feb-1951   MRN:  [id number]  PCP:  Tally Due, MD    Chief Complaint: Medication Refill, Hand Pain and consultation   History of Present Illness:  Tyler [name] is a 61 y.o. very pleasant male patient who presents with the following: Patient here for follow up of his finger, area of necrosis has improved. Has small area of residual scar tissue, but necrotic area has resolved completely. He is scheduled for CT Arteriogram of his hand, at [facility], the physician there is asking if patient needs to discontinue Pletal prior to this exam. Patient has peripheral vascular disease believed to be caused by his long smoking history. He also has letter from his disability company, his disability has been approved, but only thru 04/09/12.  He has had 3 recent abdominal surgeries, and is recovering from these, he had complicatios including infection, and failure of incisions to heal.   Patient also asking for stronger pain medications, he has to double up on his Vicodin, he requests Rx for Oxycodone,which has worked in past. Have discussed utilizing longer acting 12 hour medication with him, however he declines, it has not been helpful for him in the past. He states most of his current pain is from his feet, especially in am. He has problems with edema and with paresthesias characterized as a pins and needles sensation with some burning. Vascular surgery a [facility] feels like his peripheral symptoms may be related to vascular insufficiency and that workup is continuing. He has underlying osteoarthritis of multiple joints.  His recent inactivity secondary to all his medical problems this created significant depression. Over the past month he has increased his activity beginning to renovate house for his daughter to live and and he is much happier although  this creates increased overall joint discomfort especially early in the morning.  At his last office visit he was given Depo-Medrol injection 80 mg, and this provided him with 10 days of pain-free activity regarding his joints. He requests more of the same day fully realizing that everyone hopes to decrease his steroid need over the next year.   Patient Active Problem List  Diagnosis  . CROHN'S DISEASE  . Pain in hand  . Occlusion and stenosis of carotid artery without mention of cerebral infarction  . COPD with asthma  . HTN (hypertension)  . Nicotine addiction  . TIA (transient ischemic attack)  . Buerger disease  . H/O colectomy  . Pulmonary embolus  . Sinus tachycardia  . DOE (dyspnea on exertion)  . Insomnia  . Ischemia of digits of hand    Past Medical History  Diagnosis Date  . Hyperlipidemia   . Crohn disease   . Anemia   . COPD (chronic obstructive pulmonary disease)   . Hypertension   . Arthritis   . Anxiety   . Joint pain   . Pulmonary embolus 2013  . Stroke 06/14/2011  . Thromboangiitis obliterans (Buerger's disease)     Past Surgical History  Procedure Date  . Crohn's surgery [id number]  . Appendectomy 1979  . Small intestine surgery   . Colon surgery   . Stomach surgery 03/01/12  . Colostomy closure 12/18/11    History  Substance Use Topics  . Smoking status: Current Every Day Smoker -- 0.5 packs/day for 35 years    Types:  Cigarettes, Cigars  . Smokeless tobacco: Never Used  . Alcohol Use: Yes     drinks 2 alcohol beverages per day    Family History  Problem Relation Age of Onset  . Colon cancer      no family Hx of  . Cancer Father     meta  . Clotting disorder Mother     blood clot  . Diabetes Sister     x2    Allergies  Allergen Reactions  . Rivaroxaban     ???? Rash-see Lupton Bx    Medication list has been reviewed and updated.  Current Outpatient Prescriptions on File Prior to Visit  Medication Sig Dispense Refill  .  albuterol (PROVENTIL HFA;VENTOLIN HFA) 108 (90 BASE) MCG/ACT inhaler Inhale 2 puffs into the lungs every 6 (six) hours as needed. For shortness of breath  1 Inhaler  11  . amitriptyline (ELAVIL) 100 MG tablet Take 1 tablet (100 mg total) by mouth at bedtime.  30 tablet  3  . amLODipine (NORVASC) 10 MG tablet Take 1 tablet (10 mg total) by mouth daily.  90 tablet  3  . cetirizine (ZYRTEC) 10 MG tablet Take 1 tablet (10 mg total) by mouth daily.  30 tablet  11  . cilostazol (PLETAL) 100 MG tablet Take 100 mg by mouth 2 (two) times daily.      Marland Kitchen HYDROcodone-acetaminophen (NORCO) 7.5-325 MG per tablet Take 1 tablet by mouth every 6 (six) hours as needed for pain.  120 tablet  0  . hydrOXYzine (VISTARIL) 100 MG capsule Take 100 mg by mouth 3 (three) times daily as needed.      Marland Kitchen ipratropium (ATROVENT) 0.02 % nebulizer solution Take 500 mcg by nebulization 4 (four) times daily as needed.      . metoprolol succinate (TOPROL-XL) 50 MG 24 hr tablet TAKE 1 TABLET BY MOUTH EVERY DAY  90 tablet  0  . tiotropium (SPIRIVA) 18 MCG inhalation capsule Place 1 capsule (18 mcg total) into inhaler and inhale daily.  30 capsule  5  . nitroGLYCERIN (NITRODUR - DOSED IN MG/24 HR) 0.3 mg/hr Place 1 patch onto the skin daily.      . ondansetron (ZOFRAN) 4 MG tablet Take 1 tablet (4 mg total) by mouth every 8 (eight) hours as needed for nausea.  20 tablet  0  . predniSONE (DELTASONE) 10 MG tablet 1 po bid pc for drug reaction  21 tablet  2  . Rivaroxaban 20 MG TABS Take 20 mg by mouth daily.  30 tablet  5  . temazepam (RESTORIL) 30 MG capsule Take 1 capsule (30 mg total) by mouth at bedtime as needed for sleep.  30 capsule  0  . triamcinolone cream (KENALOG) 0.1 % Apply 1 application topically 2 (two) times daily. Stop using on face after 5 days      . triamcinolone cream (KENALOG) 0.1 % Apply topically 2 (two) times daily. Stop using on face after 5 days  45 g  0  . DISCONTD: lisinopril (PRINIVIL,ZESTRIL) 10 MG tablet Take  10 mg by mouth daily.      Marland Kitchen DISCONTD: losartan (COZAAR) 25 MG tablet Take 50 mg by mouth daily.         Review of Systems:  Continues to smoke/unable to quit No vision changes No confusion/no headaches Depression improved No weight loss or night sweats Abdominal incision healing by secondary intention without recent infection Colostomy closure working well  Physical Examination: Filed Vitals:   03/31/12  1219  BP: 139/83  Pulse: 98  Temp: 97.4 F (36.3 C)  Resp: 16   Filed Vitals:   03/31/12 1219  Height: 5\' 8"  (1.727 m)  Weight: 152 lb (68.947 kg)   Body mass index is 23.11 kg/(m^2). Ideal Body Weight: Weight in (lb) to have BMI = 25: 164.1   HEENT clear No carotid bruits Heart rate lowered today at 95 than in the past Lungs without wheezing today Abdominal incision without secondary infection at this point. Slowly healing Trace to 1+ edema at lower extremities with intact peripheral pulses No definite sensory loss Gait intact  Assessment and Plan: 1. Buerger disease    2. COPD with asthma  Refill spiriva  3. Ischemia of digits of hand  This is improving  4. Nicotine addiction  Discussed further cessation  5. Pain in hand / and feet Continue pain meds to allow sleep and to allow activity  6. Arthralgia  methylPREDNISolone acetate (DEPO-MEDROL) injection 80 mg//discussed next steroid options in 6 weeks with need to wean off  7. Edema  ? secondary to meds /Might consider a change in diuretics if this continues  8. Paresthesias  ?etiology  9   Poor healing of abdominal surgery incision        Slowly improving   Meds ordered this encounter  Medications  . tiotropium (SPIRIVA HANDIHALER) 18 MCG inhalation capsule    Sig: Place 1 capsule (18 mcg total) into inhaler and inhale daily.    Dispense:  30 capsule    Refill:  5  . oxyCODONE-acetaminophen (ROXICET) 5-325 MG per tablet    Sig: 1-2 tabs every 6-8 hours prn pain    Dispense:  180 tablet    Refill:  0    Continue current treatment, proceed with [facility] in [facility] as scheduled No need to stop Pletal for arteriogram Extend disability 3 months It is important to continue to focus on his mental outlook/he is improved OV

## 2012-03-31 NOTE — Telephone Encounter (Signed)
Patient was back in office today and he is much better.

## 2012-04-02 ENCOUNTER — Telehealth: Payer: Self-pay

## 2012-04-02 NOTE — Telephone Encounter (Signed)
Patient is returning our phone call.   BEST#: [id number]

## 2012-04-03 NOTE — Telephone Encounter (Signed)
I am unsure who called him, called him to see if he is in need of anything. He is checking on his disability forms, do you have them?

## 2012-04-05 NOTE — Telephone Encounter (Signed)
My memory is we sent them but you might check the mess in my box!!

## 2012-04-08 ENCOUNTER — Ambulatory Visit (INDEPENDENT_AMBULATORY_CARE_PROVIDER_SITE_OTHER): Payer: Managed Care, Other (non HMO) | Admitting: Internal Medicine

## 2012-04-08 VITALS — BP 122/80 | HR 84 | Temp 98.1°F | Resp 18 | Ht 68.0 in | Wt 149.0 lb

## 2012-04-08 DIAGNOSIS — F17200 Nicotine dependence, unspecified, uncomplicated: Secondary | ICD-10-CM

## 2012-04-08 DIAGNOSIS — I731 Thromboangiitis obliterans [Buerger's disease]: Secondary | ICD-10-CM

## 2012-04-08 DIAGNOSIS — I999 Unspecified disorder of circulatory system: Secondary | ICD-10-CM

## 2012-04-08 DIAGNOSIS — I998 Other disorder of circulatory system: Secondary | ICD-10-CM

## 2012-04-08 MED ORDER — RIVAROXABAN 20 MG PO TABS: 20.0000 mg | ORAL_TABLET | Freq: Every day | ORAL | Status: DC

## 2012-04-08 NOTE — Telephone Encounter (Signed)
I remember him giving [place] the forms, but I did not keep them, they are not in disability office. I will try to go through the scan box, to see if they were sent there in error. They are to extend his disability. He brought them in on 03/31/12, they were on his clipboard.

## 2012-04-08 NOTE — Progress Notes (Signed)
  Subjective:    Patient ID: Tyler [name], male    DOB: 24-Apr-1951, 61 y.o.   MRN: [id number]  HPI Paresthesias/tingling w/ sl swelling for few months in L foot with pain in toe and now new ulcer on great toe pad Finger ulcer better [facility] sent him for change of med from pletal back to xarelto Increased eccymoses on arms  Smoking a little less Review of Systems Sleeping a little better    Objective:   Physical Exam Blood pressure 122/80/pulse 84/pulse ox 97% Ulcer on digit improved Left foot with good peripheral pulses but with a new ischemic ulcer on the pad of the great toe No skin discoloration or edema Calf and thigh intact without swelling or cords Extensive ecchymoses on arms of chronic duration      Assessment & Plan:   1. Buerger disease  Rivaroxaban (XARELTO) 20 MG TABS  2. Ischemia of digits of hand  Rivaroxaban (XARELTO) 20 MG TABS  3. Nicotine addiction    Next followup [place] in 2 weeks Followup here 1 month for med refills and reevaluation/sooner if worse

## 2012-04-08 NOTE — Telephone Encounter (Signed)
I have checked the box, they are not in there. I think you did fill them out. I will look to see if they were sent to be scanned. Tyler [name]

## 2012-04-11 ENCOUNTER — Telehealth: Payer: Self-pay

## 2012-04-11 NOTE — Telephone Encounter (Signed)
Lily spoke with patient and he will sign a release for [place] to get his vascular evaluation from [facility].

## 2012-04-11 NOTE — Telephone Encounter (Signed)
Message copied by Johnnette Litter on Thu Apr 11, 2012 11:29 AM ------      Message from: Tonye Pearson      Created: Mon Apr 08, 2012 10:52 PM       We need OV from most recent [facility] vascular evaluation

## 2012-04-12 ENCOUNTER — Ambulatory Visit: Payer: Managed Care, Other (non HMO) | Admitting: Gastroenterology

## 2012-04-16 ENCOUNTER — Other Ambulatory Visit: Payer: Self-pay | Admitting: Internal Medicine

## 2012-04-16 NOTE — Telephone Encounter (Signed)
At tl desk 

## 2012-04-30 ENCOUNTER — Ambulatory Visit (INDEPENDENT_AMBULATORY_CARE_PROVIDER_SITE_OTHER): Payer: Managed Care, Other (non HMO) | Admitting: Internal Medicine

## 2012-04-30 VITALS — BP 116/70 | HR 111 | Temp 98.3°F | Resp 18 | Ht 69.0 in | Wt 152.0 lb

## 2012-04-30 DIAGNOSIS — I2699 Other pulmonary embolism without acute cor pulmonale: Secondary | ICD-10-CM

## 2012-04-30 DIAGNOSIS — I731 Thromboangiitis obliterans [Buerger's disease]: Secondary | ICD-10-CM

## 2012-04-30 DIAGNOSIS — I999 Unspecified disorder of circulatory system: Secondary | ICD-10-CM

## 2012-04-30 DIAGNOSIS — I998 Other disorder of circulatory system: Secondary | ICD-10-CM

## 2012-04-30 DIAGNOSIS — K5090 Crohn's disease, unspecified, without complications: Secondary | ICD-10-CM

## 2012-04-30 MED ORDER — METHYLPREDNISOLONE ACETATE 80 MG/ML IJ SUSP
120.0000 mg | Freq: Once | INTRAMUSCULAR | Status: AC
  Administered 2012-04-30: 120 mg via INTRAMUSCULAR

## 2012-04-30 MED ORDER — HYDROCODONE-ACETAMINOPHEN 7.5-325 MG PO TABS: 1.0000 | ORAL_TABLET | Freq: Three times a day (TID) | ORAL | Status: DC | PRN

## 2012-04-30 NOTE — Patient Instructions (Signed)
Acute Ischemic Limb Acute ischemic limb develops when an arm or leg (limb) has trouble getting enough blood. This happens when an artery (a large blood vessel that carries oxygen and nutrients that feed your body) is partially or totally blocked. It is also called critical limb ischemia. The problem affects the legs more often than the arms. It is described as acute when the condition comes on suddenly. When this happens, it is considered a medical emergency. CAUSES  An ischemic limb almost always is the result of underlying atherosclerotic disease (when artery walls become thick and hard). It develops because of the buildup of fatty material that hardens (plaque) in arteries and veins. This buildup narrows the blood vessels and keeps tissues in the limb from getting enough blood. Small amounts of plaque can break off from the walls of the blood vessels and create a clot. This blocks the blood flow and causes ischemic limb. Other factors that can make ischemic limb more likely are:  Smoking.  High blood pressure.  Diabetes.  High cholesterol levels.  Abnormal heart rhythms (for example, a common one called "atrial fibrillation")  Past operations on the heart and/or blood vessels.  Past problems with blood clots.  Past injury (such as burns or a broken bone) that damaged blood vessels in the limb. Other conditions can cause ischemic limb, although these are rare. They include:  Buerger's disease, caused by inflamed blood vessels in the hands and feet. This is also called thromboangiitis obliterans.  Some forms of arthritis.  Rare birth defects affecting the arteries of the legs. SYMPTOMS  The major signs and symptoms of ischemic limb are:   Pain in the leg while resting (also called "rest pain"). This is sometimes described as a burning pain in the foot that gets worse when lying down.  The leg or arm is cool to the touch.  The leg or arm lacks color.  The leg or arm will not  move.  There is numbness, a tingling or a prickling sensation in the limb.  Blood flow or pulse cannot be found by your caregiver in the arm or leg. DIAGNOSIS  In diagnosing ischemic limb a caregiver considers:   The person's description of how the leg or arm has been feeling.  The results of the caregiver's own examination.  Blood tests. PROGNOSIS  With timely emergency treatment, there is a greater than 50-50 chance that a caregiver can fix or remove the block in the artery that is causing the ischemic limb. This could require medication and, possibly, an operation. If medical care is not gotten quickly enough, the limb might need to be surgically removed (amputated). TREATMENT  Possible treatments for ischemic limb include:  Medications to break down blood clots and let blood flow more easily. These medicines must be given directly into a blood vessel and requires staying the hospital.  Revascularization, which is surgery to improve blood flow by putting in new blood vessels.  Angioplasty, which is surgery that places a small balloon in an artery to open blood flow.  Surgery to place a stent (a flexible metal tube) in an artery to keep it open.  Amputation, which is surgical removal of some or all of the affected limb. RELATED COMPLICATIONS People who are treated for acute ischemic limb are more likely to:  Have more-than-normal bleeding caused by blood-thinning medications.  Have ongoing pain.  Develop an infection in the arm or leg. If surgery is needed, possible complications could include:  A possible  need for blood transfusions.  Infection.  Swelling inside the limb that squeezes the blood vessels. This is called "Compartment Syndrome", a condition that requires more surgery.  A long recovery time.  Loss of mobility.  A need for amputation.  The need for more surgery. Document Released: 03/14/2008 Document Revised: 08/28/2011 Document Reviewed:  03/14/2008 [facility] Patient Information 2013 [place], [place]. Pulmonary Embolus A pulmonary (lung) embolus (PE) is a blood clot that has traveled from another place in the body to the lung. Most clots come from deep veins in the legs or pelvis. PE is a dangerous and potentially life-threatening condition that can be treated if identified. CAUSES Blood clots form in a vein for different reasons. Usually several things cause blood clots. They include:  The flow of blood slows down.  The inside of the vein is damaged in some way.  The person has a condition that makes the blood clot more easily. These conditions may include:  Older age (especially over 75 years old).  Having a history of blood clots.  Having major or lengthy surgery. Hip surgery is particularly high-risk.  Breaking a hip or leg.  Sitting or lying still for a long time.  Cancer or cancer treatment.  Having a long, thin tube (catheter) placed inside a vein during a medical procedure.  Being overweight (obese).  Pregnancy and childbirth.  Medicines with estrogen.  Smoking.  Other circulation or heart problems. SYMPTOMS  The symptoms of a PE usually start suddenly and include:  Shortness of breath.  Coughing.  Coughing up blood or blood-tinged mucus (phlegm).  Chest pain. Pain is often worse with deep breaths.  Rapid heartbeat. DIAGNOSIS  If a PE is suspected, your caregiver will take a medical history and carry out a physical exam. Your caregiver will check for the risk factors listed above. Tests that also may be required include:  Blood tests, including studies of the clotting properties of your blood.  Imaging tests. Ultrasound, CT, MRI, and other tests can all be used to see if you have clots in your legs or lungs. If you have a clot in your legs and have breathing or chest problems, your caregiver may conclude that you have a clot in your lungs. Further lung tests may not be  needed.  Electrocardiography can look for heart strain from blood clots in the lungs. PREVENTION   Exercise the legs regularly. Take a brisk 30 minute walk every day.  Maintain a weight that is appropriate for your height.  Avoid sitting or lying in bed for long periods of time without moving your legs.  Women, particularly those over the age of 71, should consider the risks and benefits of taking estrogen medicines, including birth control pills.  Do not smoke, especially if you take estrogen medicines.  Long-distance travel can increase your risk. You should exercise your legs by walking or pumping the muscles every hour.  In hospital prevention:  Your caregiver will assess your need for preventive PE care (prophylaxis) when you are admitted to the hospital. If you are having surgery, your surgeon will assess you the day of or day after surgery.  Prevention may include medical and nonmedical measures. TREATMENT   The most common treatment for a PE is blood thinning (anticoagulant) medicine, which reduces the blood's tendency to clot. Anticoagulants can stop new blood clots from forming and old ones from growing. They cannot dissolve existing clots. Your body does this by itself over time. Anticoagulants can be given by mouth,  by intravenous (IV) access, or by injection. Your caregiver will determine the best program for you.  Less commonly, clot-dissolving drugs (thrombolytics) are used to dissolve a PE. They carry a high risk of bleeding, so they are used mainly in severe cases.  Very rarely, a blood clot in the leg needs to be removed surgically.  If you are unable to take anticoagulants, your caregiver may arrange for you to have a filter placed in a main vein in your abdomen. This filter prevents clots from traveling to your lungs. HOME CARE INSTRUCTIONS   Take all medicines prescribed by your caregiver. Follow the directions carefully.  Warfarin. Most people will continue  taking warfarin after hospital discharge. Your caregiver will advise you on the length of treatment (usually 3 6 months, sometimes lifelong).  Too much and too little warfarin are both dangerous. Too much warfarin increases the risk of bleeding. Too little warfarin continues to allow the risk for blood clots. While taking warfarin, you will need to have regular blood tests to measure your blood clotting time. These blood tests usually include both the prothrombin time (PT) and International Normalized Ratio (INR) tests. The PT and INR results allow your caregiver to adjust your dose of warfarin. The dose can change for many reasons. It is critically important that you take warfarin exactly as prescribed, and that you have your PT and INR levels drawn exactly as directed.  Many foods, especially foods high in vitamin K can interfere with warfarin and affect the PT and INR results. Foods high in vitamin K include spinach, kale, broccoli, cabbage, collard and turnip greens, brussels sprouts, peas, cauliflower, seaweed, and parsley as well as beef and pork liver, green tea, and soybean oil. You should eat a consistent amount of foods high in vitamin K. Avoid major changes in your diet, or notify your caregiver before changing your diet. Arrange a visit with a dietitian to answer your questions.  Many medicines can interfere with warfarin and affect the PT and INR results. You must tell your caregiver about any and all medicines you take, this includes all vitamins and supplements. Be especially cautious with aspirin and anti-inflammatory medicines. Ask your caregiver before taking these. Do not take or discontinue any prescribed or over-the-counter medicine except on the advice of your caregiver or pharmacist.  Warfarin can have side effects, such as excessive bruising or bleeding. You will need to hold pressure over cuts for longer than usual.  Alcohol can change the body's ability to handle warfarin. It is  best to avoid alcoholic drinks or consume only very small amounts while taking warfarin. Notify your caregiver if you change your alcohol intake.  Notify your dentist or other caregivers before procedures.  Avoid contact sports.  Wear a medical alert bracelet or carry a medical alert card.  Ask your caregiver how soon you can go back to normal activities. Not being active can lead to new clots. Ask for a list of what you should and should not do.  Compression stockings. These are tight elastic stockings that apply pressure to the lower legs. This can help keep the blood in the legs from clotting. You may need to wear compressions stockings at home to help prevent clots.  Smoking. If you smoke, quit. Ask your caregiver for help with quitting smoking.  Learn as much as you can about PE. Educating yourself can help prevent PE from reoccurring. SEEK MEDICAL CARE IF:   You notice a rapid heartbeat.  You feel weaker  or more tired than usual.  You feel faint.  You notice increased bruising.  Your symptoms are not getting better in the time expected.  You are having side effects of medicine. SEEK IMMEDIATE MEDICAL CARE IF:   You have chest pain.  You have trouble breathing.  You have new or increased swelling or pain in one leg.  You cough up blood.  You notice blood in vomit, in a bowel movement, or in urine.  You have an oral temperature above 102 F (38.9 C), not controlled by medicine. You may have another PE. A blood clot in the lungs is a medical emergency. Call your local emergency services (911 in [place] to get to the nearest hospital or clinic. Do not drive yourself. MAKE SURE YOU:   Understand these instructions.  Will watch your condition.  Will get help right away if you are not doing well or get worse. Document Released: 06/02/2000 Document Revised: 12/05/2011 Document Reviewed: 12/07/2008 [facility] Patient Information 2013 [place], [place].

## 2012-04-30 NOTE — Progress Notes (Signed)
  Subjective:    Patient ID: Tyler [name], male    DOB: Jan 04, 1951, 61 y.o.   MRN: [id number]  HPI Crohns disease inactive Buergers disease active Ischemia everywhere On xaralto from [facility] for arterial block Chronic tachycardia Continues to smoke   Review of Systems May have new PE    Objective:   Physical Exam  Constitutional: He appears well-developed and well-nourished. No distress.  HENT:  Nose: Nose normal.  Eyes: EOM are normal. No scleral icterus.  Neck: Normal range of motion. Neck supple.  Cardiovascular: Regular rhythm and normal heart sounds.  Tachycardia present.  Exam reveals no decreased pulses.   Pulmonary/Chest: Effort normal. Not tachypneic. He has no decreased breath sounds. He has wheezes.  Lymphadenopathy:    He has no cervical adenopathy.  Skin: Skin is warm and dry.          Open surgical abdomin          Assessment & Plan:  RF hc 7.5

## 2012-05-02 ENCOUNTER — Other Ambulatory Visit: Payer: Self-pay | Admitting: Physician Assistant

## 2012-05-10 ENCOUNTER — Other Ambulatory Visit: Payer: Self-pay | Admitting: Internal Medicine

## 2012-05-19 ENCOUNTER — Other Ambulatory Visit: Payer: Self-pay | Admitting: Physician Assistant

## 2012-05-25 ENCOUNTER — Ambulatory Visit (INDEPENDENT_AMBULATORY_CARE_PROVIDER_SITE_OTHER): Payer: Managed Care, Other (non HMO) | Admitting: Internal Medicine

## 2012-05-25 VITALS — BP 128/78 | HR 92 | Temp 98.0°F | Resp 24 | Ht 68.25 in | Wt 144.4 lb

## 2012-05-25 DIAGNOSIS — G4700 Insomnia, unspecified: Secondary | ICD-10-CM

## 2012-05-25 DIAGNOSIS — R630 Anorexia: Secondary | ICD-10-CM

## 2012-05-25 DIAGNOSIS — R251 Tremor, unspecified: Secondary | ICD-10-CM

## 2012-05-25 DIAGNOSIS — F17200 Nicotine dependence, unspecified, uncomplicated: Secondary | ICD-10-CM

## 2012-05-25 DIAGNOSIS — J449 Chronic obstructive pulmonary disease, unspecified: Secondary | ICD-10-CM

## 2012-05-25 DIAGNOSIS — R109 Unspecified abdominal pain: Secondary | ICD-10-CM

## 2012-05-25 DIAGNOSIS — M1990 Unspecified osteoarthritis, unspecified site: Secondary | ICD-10-CM

## 2012-05-25 DIAGNOSIS — D649 Anemia, unspecified: Secondary | ICD-10-CM

## 2012-05-25 DIAGNOSIS — G629 Polyneuropathy, unspecified: Secondary | ICD-10-CM

## 2012-05-25 DIAGNOSIS — G609 Hereditary and idiopathic neuropathy, unspecified: Secondary | ICD-10-CM

## 2012-05-25 DIAGNOSIS — F411 Generalized anxiety disorder: Secondary | ICD-10-CM

## 2012-05-25 DIAGNOSIS — R634 Abnormal weight loss: Secondary | ICD-10-CM

## 2012-05-25 DIAGNOSIS — R259 Unspecified abnormal involuntary movements: Secondary | ICD-10-CM

## 2012-05-25 LAB — FOLATE: Folate: 12.3 ng/mL

## 2012-05-25 LAB — POCT CBC
Granulocyte percent: 63.2 %G (ref 37–80)
HCT, POC: 45.9 % (ref 43.5–53.7)
MCV: 99.6 fL — AB (ref 80–97)
POC LYMPH PERCENT: 26.3 %L (ref 10–50)
RBC: 4.61 M/uL — AB (ref 4.69–6.13)
RDW, POC: 16 %

## 2012-05-25 LAB — COMPREHENSIVE METABOLIC PANEL
CO2: 28 mEq/L (ref 19–32)
Creat: 0.9 mg/dL (ref 0.50–1.35)
Glucose, Bld: 77 mg/dL (ref 70–99)
Total Bilirubin: 1.1 mg/dL (ref 0.3–1.2)

## 2012-05-25 LAB — POCT UA - MICROSCOPIC ONLY
Mucus, UA: NEGATIVE
RBC, urine, microscopic: NEGATIVE
WBC, Ur, HPF, POC: NEGATIVE

## 2012-05-25 LAB — VITAMIN B12: Vitamin B-12: 257 pg/mL (ref 211–911)

## 2012-05-25 LAB — POCT GLYCOSYLATED HEMOGLOBIN (HGB A1C): Hemoglobin A1C: 5

## 2012-05-25 MED ORDER — METHYLPREDNISOLONE ACETATE 80 MG/ML IJ SUSP
80.0000 mg | Freq: Once | INTRAMUSCULAR | Status: AC
  Administered 2012-05-25: 80 mg via INTRAMUSCULAR

## 2012-05-25 MED ORDER — PREDNISONE 10 MG PO TABS: 10.0000 mg | ORAL_TABLET | Freq: Every day | ORAL | Status: DC

## 2012-05-25 MED ORDER — HYDROCODONE-ACETAMINOPHEN 7.5-325 MG PO TABS: 1.0000 | ORAL_TABLET | Freq: Three times a day (TID) | ORAL | Status: DC | PRN

## 2012-05-25 MED ORDER — TEMAZEPAM 30 MG PO CAPS: 30.0000 mg | ORAL_CAPSULE | Freq: Every evening | ORAL | Status: DC | PRN

## 2012-05-25 MED ORDER — CILIDINIUM-CHLORDIAZEPOXIDE 2.5-5 MG PO CAPS: 1.0000 | ORAL_CAPSULE | Freq: Three times a day (TID) | ORAL | Status: DC | PRN

## 2012-05-25 NOTE — Patient Instructions (Addendum)
Take librax before meals

## 2012-05-25 NOTE — Progress Notes (Signed)
Subjective:    Patient ID: Tyler [name], male    DOB: 05/08/1951, 61 y.o.   MRN: [id number]  HPIcontinues to have marked trouble with his health Over last 2 weeks has been unable to sleep due to all his pains(ischemic digits, arthritis, crohn's, healing abd surgical scar etc. He also feels anxious all the time/can't stop worrying. Is shaking a lot in extremities. Has lost 10 lbs in last month--nothing tastes good so he stops eating early and skips meals. Has early satiety, No dyspepsia, reflux, or abd pain. Crohn's has been variably active with some bloody BMs. 4 days ago had episode of vomiting  4 times during day, but this and nausea have resolved. Vague discomfort in RUQ.  Has R flank discomfort and suspects another stone-on & off for 5 days-no hematuria. No dysuria but mild frequency/no obst to flow. No fever or chills.  Has just been to [facility] where digits were thought to be stable/ scans suggest to them that complaints of peripheral numbness are not vascular and need to evaluated by a neurologist. He has burning and pins and needles in feet bilat which is worse in the am. No chg in gait or balance.  Still smoking, but less.  Still on Xarelto. C/O eccymoses on arms. No gum bleeding.  No change in breathing/DOE etc as continues inhalers.    Review of Systems  Constitutional: Positive for activity change, appetite change, fatigue and unexpected weight change. Negative for fever and chills.  HENT: Negative for nosebleeds, neck pain and neck stiffness.   Eyes: Negative for photophobia and visual disturbance.  Respiratory: Positive for cough, chest tightness, shortness of breath and wheezing. Negative for apnea.   Cardiovascular: Negative for chest pain.       Continues w/ trace edema  Gastrointestinal: Negative for constipation.  Musculoskeletal: Positive for joint swelling and arthralgias.       Hands are swollen and painful espec in am  Neurological: Positive for tremors. Negative  for seizures, syncope and speech difficulty.       Denies withdrawal from ETOH, Benzos, and has been off prednisone for 1 month  Psychiatric/Behavioral: Positive for sleep disturbance, dysphoric mood and agitation. Negative for suicidal ideas, hallucinations, confusion and self-injury. The patient is nervous/anxious.        Objective:   Physical Exam  Constitutional: He is oriented to person, place, and time. He appears distressed.       tremulous  HENT:  Nose: Nose normal.  Mouth/Throat: Oropharynx is clear and moist.  Eyes: Conjunctivae normal and EOM are normal. Pupils are equal, round, and reactive to light. No scleral icterus.  Neck: Normal range of motion. No thyromegaly present.  Cardiovascular: Normal rate, regular rhythm, normal heart sounds and intact distal pulses.  Exam reveals no gallop.   No murmur heard. Pulmonary/Chest:       Distant diminished sounds/wheezes and rhonchi bilat   Abdominal: He exhibits no distension. There is no guarding.       Active BS. No tenderness to palp or percuss. No organomeg or masses. Midline incision w/ only 2cm left still healing/scar tender. No CVA tend to percuss  Musculoskeletal: He exhibits tenderness.       Hands w/ arth changes at pip/dip areas w/ mod deformity Trace perip edema. No defn sens losses.  Lymphadenopathy:    He has no cervical adenopathy.  Neurological: He is alert and oriented to person, place, and time. He has normal reflexes. He displays normal reflexes. No cranial nerve deficit.  He exhibits abnormal muscle tone. Coordination normal.       hyperreflexic  Skin:       Scattered ecchymoses  Psychiatric: Judgment and thought content normal.       Anxious mood/affect appropriate          Results for orders placed in visit on 05/25/12  POCT CBC      Component Value Range   WBC 7.9  4.6 - 10.2 K/uL   Lymph, poc 2.1  0.6 - 3.4   POC LYMPH PERCENT 26.3  10 - 50 %L   MID (cbc) 0.8  0 - 0.9   POC MID % 10.5  0 - 12  %M   POC Granulocyte 5.0  2 - 6.9   Granulocyte percent 63.2  37 - 80 %G   RBC 4.61 (*) 4.69 - 6.13 M/uL   Hemoglobin 14.0 (*) 14.1 - 18.1 g/dL   HCT, POC 84.6  96.2 - 53.7 %   MCV 99.6 (*) 80 - 97 fL   MCH, POC 30.4  27 - 31.2 pg   MCHC 30.5 (*) 31.8 - 35.4 g/dL   RDW, POC 95.2     Platelet Count, POC 258  142 - 424 K/uL   MPV 8.0  0 - 99.8 fL  POCT UA - MICROSCOPIC ONLY      Component Value Range   WBC, Ur, HPF, POC neg     RBC, urine, microscopic neg     Bacteria, U Microscopic neg     Mucus, UA neg     Epithelial cells, urine per micros 2-6     Crystals, Ur, HPF, POC neg     Casts, Ur, LPF, POC neg     Yeast, UA neg    POCT GLYCOSYLATED HEMOGLOBIN (HGB A1C)      Component Value Range   Hemoglobin A1C 5.0      Assessment & Plan:  P#1 Abd and Flank discomfort R-w/ hx stones--since U/A neg will follow P#2 10 pound wt loss since August '13-check A1C and other labs P#3 GAD w/ insomnia-needs meds P#4 IBD-mild problems P#5 ?neuropathy--will refer to neuro P#6 Arthritis-hands-depomed 80/plus orals///refill HC P#7 Nic Add w/ Buerger's ischemia-stable P#8 anemia--has improved to wnl P#9 loss of appetite--will try Librax in view of #3 P#10 Copd/nic addic-cont inhalers plus weaning cigs  Meds ordered this encounter  Medications  . temazepam (RESTORIL) 30 MG capsule    Sig: Take 1 capsule (30 mg total) by mouth at bedtime as needed for sleep.    Dispense:  30 capsule    Refill:  5  . HYDROcodone-acetaminophen (NORCO) 7.5-325 MG per tablet    Sig: Take 1 tablet by mouth every 8 (eight) hours as needed for pain.    Dispense:  90 tablet    Refill:  1  . clidinium-chlordiazePOXIDE (LIBRAX) 2.5-5 MG per capsule    Sig: Take 1 capsule by mouth 3 (three) times daily as needed.    Dispense:  60 capsule    Refill:  3  . methylPREDNISolone acetate (DEPO-MEDROL) injection 80 mg    Sig:   . predniSONE (DELTASONE) 10 MG tablet    Sig: Take 1 tablet (10 mg total) by mouth daily. For  7 days then qod for 6 more doses    Dispense:  13 tablet    Refill:  0   F/u 1 month 50 min OV

## 2012-05-26 ENCOUNTER — Encounter: Payer: Self-pay | Admitting: Internal Medicine

## 2012-05-29 ENCOUNTER — Encounter: Payer: Self-pay | Admitting: Internal Medicine

## 2012-05-30 DIAGNOSIS — Z0271 Encounter for disability determination: Secondary | ICD-10-CM

## 2012-06-10 ENCOUNTER — Other Ambulatory Visit: Payer: Self-pay | Admitting: Physician Assistant

## 2012-06-19 ENCOUNTER — Ambulatory Visit (INDEPENDENT_AMBULATORY_CARE_PROVIDER_SITE_OTHER): Payer: Managed Care, Other (non HMO) | Admitting: Internal Medicine

## 2012-06-19 VITALS — BP 142/93 | HR 110 | Temp 97.9°F | Resp 18 | Ht 69.0 in | Wt 149.8 lb

## 2012-06-19 DIAGNOSIS — K5090 Crohn's disease, unspecified, without complications: Secondary | ICD-10-CM

## 2012-06-19 DIAGNOSIS — R1110 Vomiting, unspecified: Secondary | ICD-10-CM

## 2012-06-19 DIAGNOSIS — I10 Essential (primary) hypertension: Secondary | ICD-10-CM

## 2012-06-19 DIAGNOSIS — F17200 Nicotine dependence, unspecified, uncomplicated: Secondary | ICD-10-CM

## 2012-06-19 DIAGNOSIS — K921 Melena: Secondary | ICD-10-CM

## 2012-06-19 DIAGNOSIS — J449 Chronic obstructive pulmonary disease, unspecified: Secondary | ICD-10-CM

## 2012-06-19 DIAGNOSIS — I998 Other disorder of circulatory system: Secondary | ICD-10-CM

## 2012-06-19 DIAGNOSIS — M1990 Unspecified osteoarthritis, unspecified site: Secondary | ICD-10-CM

## 2012-06-19 DIAGNOSIS — R197 Diarrhea, unspecified: Secondary | ICD-10-CM

## 2012-06-19 DIAGNOSIS — I999 Unspecified disorder of circulatory system: Secondary | ICD-10-CM

## 2012-06-19 DIAGNOSIS — I2699 Other pulmonary embolism without acute cor pulmonale: Secondary | ICD-10-CM

## 2012-06-19 DIAGNOSIS — M2550 Pain in unspecified joint: Secondary | ICD-10-CM

## 2012-06-19 LAB — COMPREHENSIVE METABOLIC PANEL
Albumin: 3.3 g/dL — ABNORMAL LOW (ref 3.5–5.2)
Alkaline Phosphatase: 98 U/L (ref 39–117)
BUN: 5 mg/dL — ABNORMAL LOW (ref 6–23)
CO2: 29 mEq/L (ref 19–32)
Calcium: 8.8 mg/dL (ref 8.4–10.5)
Glucose, Bld: 89 mg/dL (ref 70–99)
Potassium: 3.4 mEq/L — ABNORMAL LOW (ref 3.5–5.3)
Sodium: 139 mEq/L (ref 135–145)
Total Protein: 5.9 g/dL — ABNORMAL LOW (ref 6.0–8.3)

## 2012-06-19 LAB — POCT CBC
Granulocyte percent: 58.3 %G (ref 37–80)
HCT, POC: 41.8 % — AB (ref 43.5–53.7)
Hemoglobin: 13.4 g/dL — AB (ref 14.1–18.1)
Lymph, poc: 2.6 (ref 0.6–3.4)
MCV: 98.9 fL — AB (ref 80–97)
POC LYMPH PERCENT: 30.4 %L (ref 10–50)
RDW, POC: 16 %

## 2012-06-19 MED ORDER — PROMETHAZINE HCL 25 MG PO TABS: 25.0000 mg | ORAL_TABLET | Freq: Three times a day (TID) | ORAL | Status: DC | PRN

## 2012-06-19 MED ORDER — METHYLPREDNISOLONE ACETATE 80 MG/ML IJ SUSP
80.0000 mg | Freq: Once | INTRAMUSCULAR | Status: AC
  Administered 2012-06-19: 80 mg via INTRAMUSCULAR

## 2012-06-19 MED ORDER — HYDROCODONE-ACETAMINOPHEN 7.5-325 MG PO TABS: 1.0000 | ORAL_TABLET | Freq: Three times a day (TID) | ORAL | Status: DC | PRN

## 2012-06-19 MED ORDER — PREDNISONE 20 MG PO TABS: ORAL_TABLET | ORAL | Status: DC

## 2012-06-19 NOTE — Progress Notes (Addendum)
Subjective:    Patient ID: Tyler [name], male    DOB: 07-23-1950, 62 y.o.   MRN: [id number]  HPIvomiting for past 2 days-can barely hold water down/zofran not helping abd wound weeping blood as well tho stable the last few days/surgical followup 2 weeks ago cleared him completely Bloody diarrhea for 3 days/Crohn's disease/evaluated by Dr. Christella Hartigan recently but nothing was decided since records were incomplete with regard to recent surgeries. He has had intermittent bloody diarrhea for several months without long-term treatment at this point. He was last given pentasa in March'13 was not on this for long. No abd pain except slight discomfort for diarrhea  all joints are killing him-especially hands, ankles--he has continued problems with multiple joint complaints/evaluation by Dr. Kellie Simmering rheumatology decided rheumatoid arthritis was not present  Still on xarelto-he was started on this after his pulmonary embolus and was to take it for 3 months. March 2013. Dr. Shelle Iron said it was okay to discontinue this after 3 months of therapy. He has been continued vascular at [facility] in hopes that it will help his peripheral vascular disease causing ischemic digits. He feels like this is not helping for the digits in the same way that pletal helped. He has a followup in the next few weeks.  Smoked 5 cigs yesterday and is trying hard to quit as directed  Review of Systems  Constitutional: Positive for appetite change, fatigue and unexpected weight change. Negative for fever.  HENT: Negative for nosebleeds and mouth sores.   Respiratory: Negative for wheezing.   Cardiovascular: Negative for chest pain and palpitations.  Genitourinary: Negative for difficulty urinating.  Skin: Negative for rash.  Neurological: Negative for speech difficulty, light-headedness, numbness and headaches.       History of a TIA  Hematological: Bruises/bleeds easily.  Psychiatric/Behavioral:       Continues to be depressed  about his multiple medical problems and his inability to do anything       Objective:   Physical Exam Filed Vitals:   06/19/12 1026  BP: 142/93  Pulse: 110  Temp: 97.9 F (36.6 C)  Resp: 18   is not in acute distress Heart regular with a rate of 90 Lungs are clear with no evidence of wheezing or rhonchi today/this is in contrast with the past The abdomen is soft-bowel sounds are active/no tenderness or organomegaly/midline incision is now healed although there is some scabbing that remains The hands have prominent MCPs and PIPs without deformity or effusion/he is tender in all hand joints There are no other joint effusions although he has multiple areas of tenderness and his ankle tenderness affects his gait Ischemia of digits is stable Multiple ecchymoses over arms        Results for orders placed in visit on 06/19/12  POCT CBC      Component Value Range   WBC 8.5  4.6 - 10.2 K/uL   Lymph, poc 2.6  0.6 - 3.4   POC LYMPH PERCENT 30.4  10 - 50 %L   MID (cbc) 1.0 (*) 0 - 0.9   POC MID % 11.3  0 - 12 %M   POC Granulocyte 5.0  2 - 6.9   Granulocyte percent 58.3  37 - 80 %G   RBC 4.23 (*) 4.69 - 6.13 M/uL   Hemoglobin 13.4 (*) 14.1 - 18.1 g/dL   HCT, POC 40.9 (*) 81.1 - 53.7 %   MCV 98.9 (*) 80 - 97 fL   MCH, POC 31.7 (*) 27 -  31.2 pg   MCHC 32.1  31.8 - 35.4 g/dL   RDW, POC 62.1     Platelet Count, POC 328  142 - 424 K/uL   MPV 7.2  0 - 99.8 fL    Assessment & Plan:   1. Hematochezia -exacerbation of Crohn's disease versus complication Xarelto   2. Vomiting -? Etiology /? Acute viral   3. Pulmonary embolus -resolved  4. Nicotine addiction -improving  5. 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 HTN (hypertension)   7. CROHN'S DISEASE   8. COPD with asthma   9. Arthralgias  10. 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 Virgina Norfolk missed his recent appointment He would like to discontinue xarelto/this is okay from the standpoint of his pulmonary embolus/he will followup with vascular at [facility] He will followup in 2-3 days if not stabilized  Meds ordered this encounter  Medications  . promethazine (PHENERGAN) 25 MG tablet    Sig: Take 1 tablet (25 mg total) by mouth every 8 (eight) hours as needed for nausea.    Dispense:  20 tablet    Refill:  0  . predniSONE (DELTASONE) 20 MG tablet    Sig: 3/3/3/2/2/2/1/1/1/.5/.5/.5/.5 single daily dose for 12 days    Dispense:  20 tablet    Refill:  0  . methylPREDNISolone acetate (DEPO-MEDROL) injection 80 mg    Sig:   . HYDROcodone-acetaminophen (NORCO) 7.5-325 MG per tablet    Sig: Take 1 tablet by mouth every 8 (eight) hours as needed for pain.    Dispense:  90 tablet    Refill:  1   CMET pending  Addendum 07/14/2012 [facility] phone [phone] and Fax [phone] has deferred decision known further anticoagulation to [place] and suggested that we obtain hematology consult.

## 2012-07-14 NOTE — Addendum Note (Signed)
Addended by: Tonye Pearson on: 07/14/2012 02:21 PM   Modules accepted: Orders

## 2012-07-17 ENCOUNTER — Telehealth: Payer: Self-pay | Admitting: Hematology & Oncology

## 2012-07-17 ENCOUNTER — Telehealth: Payer: Self-pay | Admitting: Radiology

## 2012-07-17 NOTE — Telephone Encounter (Signed)
Got in touch with Earlene Plater and he said he would have patient call.

## 2012-07-17 NOTE — Telephone Encounter (Signed)
Oncology unable to contact patient for appt, letter sent Dr Merla Riches advised.

## 2012-07-17 NOTE — Telephone Encounter (Signed)
I called all of pt and his wife's numbers. They all are not working. I talked with Amy at referring and she will get in contact with patient and have him call us.

## 2012-07-17 NOTE — Telephone Encounter (Signed)
Pt made 2-20 appointment

## 2012-07-19 ENCOUNTER — Ambulatory Visit: Payer: Self-pay | Admitting: Gastroenterology

## 2012-07-19 ENCOUNTER — Telehealth: Payer: Self-pay | Admitting: Gastroenterology

## 2012-07-19 NOTE — Telephone Encounter (Signed)
Message copied by Arna Snipe on Fri Jul 19, 2012  3:10 PM ------      Message from: Donata Duff      Created: Fri Jul 19, 2012 10:56 AM       Do not bill

## 2012-07-29 ENCOUNTER — Encounter: Payer: Self-pay | Admitting: Internal Medicine

## 2012-07-29 ENCOUNTER — Ambulatory Visit (INDEPENDENT_AMBULATORY_CARE_PROVIDER_SITE_OTHER): Payer: Managed Care, Other (non HMO) | Admitting: Internal Medicine

## 2012-07-29 VITALS — BP 106/73 | HR 106 | Temp 97.7°F | Resp 16 | Ht 68.0 in | Wt 154.0 lb

## 2012-07-29 DIAGNOSIS — R1110 Vomiting, unspecified: Secondary | ICD-10-CM

## 2012-07-29 DIAGNOSIS — M129 Arthropathy, unspecified: Secondary | ICD-10-CM

## 2012-07-29 DIAGNOSIS — J449 Chronic obstructive pulmonary disease, unspecified: Secondary | ICD-10-CM

## 2012-07-29 DIAGNOSIS — K5090 Crohn's disease, unspecified, without complications: Secondary | ICD-10-CM

## 2012-07-29 DIAGNOSIS — I10 Essential (primary) hypertension: Secondary | ICD-10-CM

## 2012-07-29 DIAGNOSIS — I998 Other disorder of circulatory system: Secondary | ICD-10-CM

## 2012-07-29 DIAGNOSIS — I999 Unspecified disorder of circulatory system: Secondary | ICD-10-CM

## 2012-07-29 DIAGNOSIS — F17200 Nicotine dependence, unspecified, uncomplicated: Secondary | ICD-10-CM

## 2012-07-29 DIAGNOSIS — M79609 Pain in unspecified limb: Secondary | ICD-10-CM

## 2012-07-29 DIAGNOSIS — M2550 Pain in unspecified joint: Secondary | ICD-10-CM

## 2012-07-29 DIAGNOSIS — M79643 Pain in unspecified hand: Secondary | ICD-10-CM

## 2012-07-29 DIAGNOSIS — M1990 Unspecified osteoarthritis, unspecified site: Secondary | ICD-10-CM

## 2012-07-29 MED ORDER — METHYLPREDNISOLONE ACETATE 80 MG/ML IJ SUSP: 80.0000 mg | Freq: Once | INTRAMUSCULAR | Status: DC

## 2012-07-29 MED ORDER — METHYLPREDNISOLONE ACETATE 80 MG/ML IJ SUSP
80.0000 mg | Freq: Once | INTRAMUSCULAR | Status: AC
  Administered 2012-07-29: 80 mg via INTRAMUSCULAR

## 2012-07-29 MED ORDER — PROMETHAZINE HCL 25 MG PO TABS: 25.0000 mg | ORAL_TABLET | Freq: Three times a day (TID) | ORAL | Status: DC | PRN

## 2012-07-29 MED ORDER — HYDROCODONE-ACETAMINOPHEN 7.5-325 MG PO TABS: 1.0000 | ORAL_TABLET | Freq: Three times a day (TID) | ORAL | Status: DC | PRN

## 2012-07-29 MED ORDER — PREDNISONE 20 MG PO TABS: ORAL_TABLET | ORAL | Status: DC

## 2012-07-29 NOTE — Progress Notes (Signed)
Subjective:    Patient ID: Tyler [name], male    DOB: [birth year], 62 y.o.   MRN: [id number]  HPI this was his office visit from 06/19/2012  1.  Hematochezia -exacerbation of Crohn's disease versus complication Xarelto   2.  Vomiting -? Etiology /? Acute viral   3.  Pulmonary embolus -resolved   4.  Nicotine addiction -improving   5.  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  HTN (hypertension)   7.  CROHN'S DISEASE   8.  COPD with asthma   9.  Arthralgias   10.  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 Virgina Norfolk missed his recent appointment  He would like to discontinue xarelto/this is okay from the standpoint of his pulmonary embolus/he will followup with vascular at [facility]  He will followup in 2-3 days if not stabilized   He did well with prednisone and his joint symptoms and gastrointestinal symptoms were resolved within a few days. He was weaned from the steroids over 12-15 days and immediately began having pain in all his MCPs that affected activity/stiffness all day long/mild swelling Also has pain returning in his feet/swelling in the right foot resolved/continues to have swelling of uncertain etiology in the left foot. He has been evaluated by Dr. Truslow-rheumatology several times although not recently. He was told most emphatically that he does not have rheumatoid disease.  Unfortunately he did not keep his appointment with Dr. Christella Hartigan. He has had a return of his early satiety with vomiting over the past 3 weeks. He also has  indigestion with whatever he eats and he notices some symptoms of gastric reflux as well. His symptoms are not nocturnal. He is not having hematochezia again.  He has remained off Zarelto and has an appointment with Dr. Myna Hidalgo to determine the need for further anticoagulation. Pulmonary indicated no. Vascular at [facility] seems to think otherwise.  He has a new symptom over the last 6-12 weeks of paresthesias in his right arm and hand. He wakes after 2 hours of sleep with numbness in his hand and arm and some pain in the elbow. He has to get up and walk around for one to 2 hours for this to resolve.  There is no motor loss. Does not occur during the day, sitting for long periods have. Hand is not cold or blue. His ischemic digits have remained improved. His smoking has continued to decrease. No history of neck problems or shoulder problems. No elbow injuries.  He was told at [facility] but a CT scan of his long demonstrated lesions that needed followup but that could be scanned here. I do not have a copy of the scan but will obtain one.      Review of Systems No fever chills or night sweats/he has not had weight loss and in fact his heavier today  than he was 2 months ago No chest pain or palpitations No shortness of breath or dyspnea on exertion      Objective:   Physical Exam Vital  signs stable except pulse 106 as usual No acute distress though in obvious discomfort Grips are 2/5 bilaterally due to pain No redness but MCPs are puffy and tender PIPs slightly tender Tinel's negative on the right/right elbow within normal limits/ extension of the wrist does not produce numbness/ neck has a full range of motion without pain or radicular symptoms Deep tendon reflexes are symmetrical    Assessment & Plan:   1. 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 COPD with asthma   3. CROHN'S DISEASE s/p resection  4. HTN (hypertension)   5. Ischemia of digits of hand -stable  6.  Nicotine addiction -improving  7. Pain in hand due to #1  8. Paresthesias R hand suggesting radicular nerve compression-will follow for now   He has an appointment with Dr. Myna Hidalgo to discuss anticoagulation He needs to followup with Dr. Christella Hartigan to discuss therapy for Crohn's disease and to consider whether his arthritis symptoms are related to his Crohn's/he also may need upper endoscopy to rule out ulcer He has a steroid sensitive arthropathy-the question is whether there is another alternative so he doesn't have to be committed to chronic steroids/note last ESR fall 2013 was only 7-I'll check prior paper chart for antiCCP-from Rheum eval/Rheu factor was slightly positive  For now, given his discomfort we will restart prednisone/he will followup with his taper reaches 10 mg to consider a slower withdrawal if his symptoms are controlled  Meds ordered this encounter  Medications  . predniSONE (DELTASONE) 20 MG tablet    Sig: 3/3/3/2/2/2/1/1/1/.5/.5/.5/.5 single daily dose for 12 days    Dispense:  20 tablet    Refill:  0  . HYDROcodone-acetaminophen (NORCO) 7.5-325 MG per tablet    Sig: Take 1 tablet by mouth every 8 (eight) hours as needed for pain.    Dispense:  90 tablet    Refill:  1  . methylPREDNISolone acetate (DEPO-MEDROL) injection 80 mg    Sig:   . promethazine (PHENERGAN) 25 MG tablet    Sig: Take 1 tablet (25 mg total) by mouth every 8 (eight) hours as needed for nausea.    Dispense:  30 tablet    Refill:  0

## 2012-07-30 NOTE — Progress Notes (Signed)
Forwarded note to Dr. Perrin Maltese to request overbooking instructions. Tyler [name]

## 2012-08-05 ENCOUNTER — Encounter: Payer: Managed Care, Other (non HMO) | Admitting: Internal Medicine

## 2012-08-08 ENCOUNTER — Ambulatory Visit: Payer: Managed Care, Other (non HMO)

## 2012-08-08 ENCOUNTER — Ambulatory Visit (HOSPITAL_BASED_OUTPATIENT_CLINIC_OR_DEPARTMENT_OTHER): Payer: Managed Care, Other (non HMO) | Admitting: Hematology & Oncology

## 2012-08-08 ENCOUNTER — Other Ambulatory Visit (HOSPITAL_BASED_OUTPATIENT_CLINIC_OR_DEPARTMENT_OTHER): Payer: Managed Care, Other (non HMO) | Admitting: Lab

## 2012-08-08 VITALS — BP 132/90 | HR 97 | Temp 98.3°F | Resp 18 | Ht 68.0 in | Wt 151.0 lb

## 2012-08-08 DIAGNOSIS — Z86711 Personal history of pulmonary embolism: Secondary | ICD-10-CM

## 2012-08-08 DIAGNOSIS — Z7901 Long term (current) use of anticoagulants: Secondary | ICD-10-CM

## 2012-08-08 DIAGNOSIS — I82409 Acute embolism and thrombosis of unspecified deep veins of unspecified lower extremity: Secondary | ICD-10-CM

## 2012-08-08 DIAGNOSIS — K5090 Crohn's disease, unspecified, without complications: Secondary | ICD-10-CM

## 2012-08-08 DIAGNOSIS — D649 Anemia, unspecified: Secondary | ICD-10-CM

## 2012-08-08 DIAGNOSIS — F17200 Nicotine dependence, unspecified, uncomplicated: Secondary | ICD-10-CM

## 2012-08-08 LAB — CBC WITH DIFFERENTIAL (CANCER CENTER ONLY)
BASO#: 0 10*3/uL (ref 0.0–0.2)
Eosinophils Absolute: 0 10*3/uL (ref 0.0–0.5)
HGB: 12.7 g/dL — ABNORMAL LOW (ref 13.0–17.1)
MCH: 30.8 pg (ref 28.0–33.4)
MCV: 94 fL (ref 82–98)
MONO#: 1.3 10*3/uL — ABNORMAL HIGH (ref 0.1–0.9)
NEUT#: 13 10*3/uL — ABNORMAL HIGH (ref 1.5–6.5)
Platelets: 247 10*3/uL (ref 145–400)
RBC: 4.13 10*6/uL — ABNORMAL LOW (ref 4.20–5.70)
WBC: 16.7 10*3/uL — ABNORMAL HIGH (ref 4.0–10.0)

## 2012-08-08 LAB — CHCC SATELLITE - SMEAR

## 2012-08-08 NOTE — Progress Notes (Signed)
This office note has been dictated.

## 2012-08-09 NOTE — Progress Notes (Signed)
CC:   Robert P. Merla [name], M.D.  DIAGNOSES: 1. Pulmonary embolism. 2. Crohn disease.  HISTORY OF PRESENT ILLNESS:  Tyler [name] is a nice 62 year old white gentleman.  He has a longstanding history of Crohn disease.  He has had surgery for this.  His last surgery was at [facility] and I think it was during the past year.  He has had some healing issues with this.  He does have a history of pulmonary embolism.  He apparently developed some shortness breath and chest discomfort and tachycardia back in March 2013.  A CT angiogram was done which showed a small thrombus involving the right basal artery.  Everything else looked okay.  He is not complaining of any kind of leg swelling.  He was put on Xarelto.  He thinks he was on it for about 4 months or so. He had to stop because of the GI bleeding from Crohn disease.  He has been having issues with his hands and, I think, left foot.  He was told that this was "a blood problem."  I am not sure exactly what this means.  He has never seen a neurologist.  He apparently has Buerger disease.  This is from his smoking.  He still smokes about a pack per day.  He had a hard time getting over his abdominal surgery for his Crohn disease.  I think he had a temporary colostomy placed.  He had an obstruction from the Crohn's.  He was on steroids.  He is coming off steroids now.  I think he has been on sulfasalazine in the past, but nothing lately.  He does see quite a few doctors.  He was seen by Dr. Shelle Iron.  Dr. Shelle Iron felt that 3 months of anticoagulation with Xarelto was okay.  Again, he saw some vascular doctor down at  [facility].  I do not have any report from this doctor.  Tyler [name] again said that the vascular doctor said that he had a blood problem causing his pain in his hands.  Tyler [name] was kindly referred to the [facility] by Dr. Merla [name] to try to sort out any issues with his blood.  We are also to  see if he has any kind of underlying coagulopathy.  Tyler [name] is eating okay.  He has had no nausea or vomiting.  He has not noted any bleeding.  He has had no diarrhea.  He has had no leg swelling.  He has had no pain in his legs outside of that associated with his, I think, right foot.  PAST MEDICAL HISTORY: 1. Remarkable for the Crohn disease. 2. COPD. 3. Hypertension. 4. 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  ALLERGIES:  Xarelto (I question this).  CURRENT MEDICATIONS:  Proventil inhaler 2 puffs q.i.d. p.r.n., Elavil 100 mg p.o. q.h.s., Vicodin as needed, Atrovent nebulizer 0.5 mg as needed, Toprol-XL 50 mg p.o. daily, prednisone taper, Phenergan 12.5 mg p.o. q.8 hours p.r.n., Restoril 30 mg p.o. q.h.s. p.r.n., Spiriva inhaler 18 mcg daily, Norvasc 10 mg p.o. daily.  SOCIAL HISTORY:  Remarkable for about a 70-pack-year history of tobacco use.  He was smoking 2 packs a day or more.  He is now smoking 1 pack a day.  He has rare alcohol use.  He drinks only beer.  He has no occupational exposures.  He has not been able to work because of the issues with his, I think, Crohn disease.  FAMILY HISTORY:  Negative for any history of blood  clots.  He that his mom may have died of a blood clot, but he is not sure.  REVIEW OF SYSTEMS:  As stated in history of present illness.  No additional findings noted on a 12-system review.  PHYSICAL EXAMINATION:  General:  This is a cushingoid-appearing white gentleman in no obvious distress.  Vital signs:  Temperature of 98.3, pulse 97, respiratory rate 18, blood pressure 132/90.  Weight is 151. Head and neck:  Normocephalic, atraumatic skull.  There are no ocular or oral lesions.  There are no palpable cervical or supraclavicular lymph nodes.  Lungs:  Clear bilaterally.  Cardiac:  Regular rate and rhythm with a normal S1 and S2.  There are no murmurs, rubs, or bruits. Abdomen:  Soft with good bowel sounds.  He has a laparotomy scar.   He has a dressing on the lower part of the laparotomy scar.  There is no fluid wave.  There is no palpable hepatosplenomegaly.  Back:  No tenderness over the spine, ribs, or hips.  Extremities:  No clubbing, cyanosis, or edema.  He has no palpable venous cord in his legs.  He has decent pulses in his distal extremities.  He has good temperature in his hands and feet.  Neurological:  No focal neurological deficits.  LABORATORY STUDIES:  White cell count is 16.7, hemoglobin 12.7, hematocrit 38.8, platelet count 247.  Ferritin is 25.  Iron saturation 38%.  BUN is 8, creatinine 0.77.  Liver function tests are normal.  IMPRESSION:  Tyler [name] is a 62 year old gentleman with Crohn disease. He has a history of a pulmonary embolism.  This appeared to be a small embolus.  He was on Xarelto for about 4 months or so.  He had some GI bleeding.  I am not sure this really equates to a Xarelto "allergy."  We did send off a hypercoagulable panel on him.  I am not sure if one was ever sent off before.  I do not see one in the computer system.  One would think that him having Crohn disease would be a risk factor for thromboembolic events.  There have been studies that have shown Crohn disease to have an increased risk of blood clots.  I told Tyler [name] that he did not need to be back on blood thinner. However, I did recommend folic acid (2 mg p.o. daily) and aspirin (81 mg p.o. daily) to try to help him with decreasing clot risk.  If he has Buerger disease, the fact that he smokes is the issue.  The only way to improve his Buerger disease is for him to stop smoking.  He is on Norvasc, which I would think would help with the Buerger disease as this is a calcium channel blocker.  I do not see any indication for any scans on him.  I know that with, I think, inflammatory bowel disease, in general, there is a risk of malignancies, but I do not see anything on his physical exam that would suggest  this.  We will await the results of his hypercoagulable panel.  I do not think we need to get him back to see [place].  I just do not see that we would be adding to his medical management.  He sees enough doctors as it is.  I spent a good hour or more with Tyler [name].  He seems like a real nice guy.  He does have some interesting situations, mostly related to the Crohn disease.    ______________________________ Theron Arista  Tacy Dura, M.D. PRE/MEDQ  D:  08/08/2012  T:  08/09/2012  Job:  [id number]

## 2012-08-12 LAB — COMPREHENSIVE METABOLIC PANEL
Alkaline Phosphatase: 74 U/L (ref 39–117)
BUN: 8 mg/dL (ref 6–23)
CO2: 26 mEq/L (ref 19–32)
Creatinine, Ser: 0.77 mg/dL (ref 0.50–1.35)
Glucose, Bld: 96 mg/dL (ref 70–99)
Total Bilirubin: 0.6 mg/dL (ref 0.3–1.2)
Total Protein: 5.5 g/dL — ABNORMAL LOW (ref 6.0–8.3)

## 2012-08-12 LAB — HYPERCOAGULABLE PANEL, COMPREHENSIVE
Anticardiolipin IgA: 4 APL U/mL (ref <22)
Anticardiolipin IgG: 0 GPL U/mL (ref <23)
Anticardiolipin IgM: 4 MPL U/mL (ref <11)
Beta-2-Glycoprotein I IgA: 10 A Units (ref <20)
DRVVT: 32.4 secs (ref <42.9)
Protein C Activity: 135 % — ABNORMAL HIGH (ref 75–133)
Protein C, Total: 69 % — ABNORMAL LOW (ref 72–160)
Protein S Total: 136 % (ref 60–150)

## 2012-08-12 LAB — IRON AND TIBC
%SAT: 38 % (ref 20–55)
Iron: 99 ug/dL (ref 42–165)
TIBC: 259 ug/dL (ref 215–435)
UIBC: 160 ug/dL (ref 125–400)

## 2012-08-12 LAB — FERRITIN: Ferritin: 25 ng/mL (ref 22–322)

## 2012-08-14 ENCOUNTER — Telehealth: Payer: Self-pay | Admitting: *Deleted

## 2012-08-14 NOTE — Telephone Encounter (Signed)
Message copied by Anselm Jungling on Wed Aug 14, 2012 12:15 PM ------      Message from: Tyler [name]      Created: Tue Aug 13, 2012  8:16 PM       Call - labs look ok.  NO obvious blood clotting issues in the blood tests!!!  Pete ------

## 2012-08-14 NOTE — Telephone Encounter (Signed)
Called patient to let him know that his labs all look ok .  No obvious blood clotting issues noted in the blood tests per dr. Myna Hidalgo

## 2012-08-26 ENCOUNTER — Encounter: Payer: Managed Care, Other (non HMO) | Admitting: Internal Medicine

## 2012-08-26 ENCOUNTER — Ambulatory Visit (INDEPENDENT_AMBULATORY_CARE_PROVIDER_SITE_OTHER): Payer: Managed Care, Other (non HMO) | Admitting: Internal Medicine

## 2012-08-26 VITALS — BP 140/80 | HR 124 | Temp 98.4°F | Resp 16 | Ht 67.0 in | Wt 146.0 lb

## 2012-08-26 DIAGNOSIS — M1990 Unspecified osteoarthritis, unspecified site: Secondary | ICD-10-CM

## 2012-08-26 DIAGNOSIS — K5090 Crohn's disease, unspecified, without complications: Secondary | ICD-10-CM

## 2012-08-26 DIAGNOSIS — K639 Disease of intestine, unspecified: Secondary | ICD-10-CM

## 2012-08-26 DIAGNOSIS — H9191 Unspecified hearing loss, right ear: Secondary | ICD-10-CM

## 2012-08-26 DIAGNOSIS — R251 Tremor, unspecified: Secondary | ICD-10-CM

## 2012-08-26 DIAGNOSIS — H9190 Unspecified hearing loss, unspecified ear: Secondary | ICD-10-CM

## 2012-08-26 DIAGNOSIS — M129 Arthropathy, unspecified: Secondary | ICD-10-CM

## 2012-08-26 DIAGNOSIS — R259 Unspecified abnormal involuntary movements: Secondary | ICD-10-CM

## 2012-08-26 DIAGNOSIS — G4700 Insomnia, unspecified: Secondary | ICD-10-CM

## 2012-08-26 MED ORDER — HYDROCODONE-ACETAMINOPHEN 7.5-325 MG PO TABS: 1.0000 | ORAL_TABLET | Freq: Three times a day (TID) | ORAL | Status: DC | PRN

## 2012-08-26 MED ORDER — AMITRIPTYLINE HCL 150 MG PO TABS: ORAL_TABLET | ORAL | Status: DC

## 2012-08-26 MED ORDER — SULFASALAZINE 500 MG PO TBEC: 500.0000 mg | DELAYED_RELEASE_TABLET | Freq: Two times a day (BID) | ORAL | Status: DC

## 2012-08-26 MED ORDER — PREDNISONE 10 MG PO TABS: ORAL_TABLET | ORAL | Status: DC

## 2012-08-26 NOTE — Progress Notes (Addendum)
Subjective:    Patient ID: Tyler [name], male    DOB: 18-Dec-1950, 62 y.o.   MRN: [id number]  HPI here for followup Patient Active Problem List  Diagnosis  . CROHN'S DISEASE--no current GI symptoms /he actually skipped 20 to 25 years with no symptoms and no treatment before recent surgery   . Pain in hands and feet /ankles continues to respond well to steroids and then return whenever he drops below 20 mg a day /occasional swelling /no redness /marked stiffness /interferes with daily activities   . Occlusion and stenosis of carotid artery without mention of cerebral infarction  . COPD with asthma-asymptomatic   . HTN (hypertension)-stable   . Nicotine addiction-almost done   . TIA (transient ischemic attack)-no sequelae   . Buerger disease-vascular lesions on digits asymptomatic   . H/O colectomy  . Pulmonary embolus-see referral to doctor Ennever who agrees that no further anticoagulation is needed at this point /hypercoagulability profile is negative   . Sinus tachycardia-continues without clear etiology   . DOE (dyspnea on exertion)-remains relatively sedentary   . Insomnia-this is still a big issue despite medications Valente David offers 4-5 hours of sleep   . Ischemia of digits of hand     His life is not very good without steroids at this point     Review of Systems No fever chills or night sweats No chest pain or palpitations Ended steroids on Friday/on Saturday at throwing up after meals Used leftover Depo-Medrol which he had and his symptoms resolved on Sunday now complaining of loss of appetite/has a tremor in upper extremities Mood better this visit  Also complaining of hearing loss for the last 5 days/tried to remove wax with no success/no pain/no tinnitus/no past history of hearing loss/no allergic rhinitis     Objective:   Physical Exam Vital signs stable except pulse 124 HEENT clear/right TM and canal intact/there is a mild decrease in gross hearing on the  right Heart regular without murmur Lungs clear Hands with tenderness in the MCPs and PIPs but no redness or swelling Ankle motion tender Gen. palpation of feet tender/able to walk but gait requires warmup Deep tendon reflexes symmetrical At rest there is a fine tremor of both hands/motor and sensory intact/finger to nose intact without tremor Hearing screen reveals mild hearing loss in all frequencies in the right ear     Assessment & Plan:  Insomnia-continue temazepam/increase amitriptyline to 150 at bedtime  Tremor of unknown origin-? Benign essential tremor/? 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 amitriptyline (ELAVIL) 150 MG tablet    Sig: TAKE 1 TABLET BY MOUTH AT BEDTIME    Dispense:  30 tablet    Refill:  3  . predniSONE (DELTASONE) 10 MG tablet    Sig: 3/3/3/2.5/2.5/2.5/2/2/2/1.5/1.5/1.5/1/1/1/.5/.5/.5 single daily dose until done-18days    Dispense:  32 tablet    Refill:  0  . sulfaSALAzine (AZULFIDINE) 500 MG EC tablet    Sig: Take 1 tablet (500 mg total) by mouth 2 (two) times daily.  Dispense:  60 tablet    Refill:  1  . HYDROcodone-acetaminophen (NORCO) 7.5-325 MG per tablet    Sig: Take 1 tablet by mouth every 8 (eight) hours as needed for pain.    Dispense:  90 tablet    Refill:  1  Continue temazepam/continue Norvasc/Librax when necessary/continue folic acid/continue Toprol/ Continue Spiriva Followup one month Needs evaluation by Dr.  Amie Critchley

## 2012-08-27 NOTE — Addendum Note (Signed)
Addended by: Tonye Pearson on: 08/27/2012 10:24 PM   Modules accepted: Orders

## 2012-09-09 ENCOUNTER — Encounter: Payer: Self-pay | Admitting: Internal Medicine

## 2012-09-09 ENCOUNTER — Ambulatory Visit (INDEPENDENT_AMBULATORY_CARE_PROVIDER_SITE_OTHER): Payer: Managed Care, Other (non HMO) | Admitting: Internal Medicine

## 2012-09-09 VITALS — BP 128/70 | HR 121 | Temp 100.0°F | Resp 22 | Ht 68.0 in | Wt 147.0 lb

## 2012-09-09 DIAGNOSIS — M2550 Pain in unspecified joint: Secondary | ICD-10-CM

## 2012-09-09 DIAGNOSIS — Z0000 Encounter for general adult medical examination without abnormal findings: Secondary | ICD-10-CM

## 2012-09-09 DIAGNOSIS — G4700 Insomnia, unspecified: Secondary | ICD-10-CM

## 2012-09-09 DIAGNOSIS — J40 Bronchitis, not specified as acute or chronic: Secondary | ICD-10-CM

## 2012-09-09 DIAGNOSIS — J449 Chronic obstructive pulmonary disease, unspecified: Secondary | ICD-10-CM

## 2012-09-09 DIAGNOSIS — J441 Chronic obstructive pulmonary disease with (acute) exacerbation: Secondary | ICD-10-CM

## 2012-09-09 DIAGNOSIS — R0602 Shortness of breath: Secondary | ICD-10-CM

## 2012-09-09 LAB — COMPREHENSIVE METABOLIC PANEL
ALT: 20 U/L (ref 0–53)
AST: 25 U/L (ref 0–37)
Albumin: 3.2 g/dL — ABNORMAL LOW (ref 3.5–5.2)
Alkaline Phosphatase: 73 U/L (ref 39–117)
Potassium: 3.8 mEq/L (ref 3.5–5.3)
Sodium: 136 mEq/L (ref 135–145)
Total Bilirubin: 0.3 mg/dL (ref 0.3–1.2)
Total Protein: 5.5 g/dL — ABNORMAL LOW (ref 6.0–8.3)

## 2012-09-09 LAB — POCT CBC
HCT, POC: 40 % — AB (ref 43.5–53.7)
Hemoglobin: 12.3 g/dL — AB (ref 14.1–18.1)
Lymph, poc: 1 (ref 0.6–3.4)
MCH, POC: 29.7 pg (ref 27–31.2)
MCHC: 30.8 g/dL — AB (ref 31.8–35.4)
MCV: 96.6 fL (ref 80–97)
WBC: 9.3 10*3/uL (ref 4.6–10.2)

## 2012-09-09 LAB — TSH: TSH: 2.044 u[IU]/mL (ref 0.350–4.500)

## 2012-09-09 LAB — LIPID PANEL
HDL: 76 mg/dL (ref >39)
Total CHOL/HDL Ratio: 2 Ratio
VLDL: 16 mg/dL (ref 0–40)

## 2012-09-09 LAB — POCT UA - MICROSCOPIC ONLY
WBC, Ur, HPF, POC: NEGATIVE
Yeast, UA: NEGATIVE

## 2012-09-09 LAB — POCT URINALYSIS DIPSTICK
Bilirubin, UA: NEGATIVE
Glucose, UA: NEGATIVE
Ketones, UA: NEGATIVE
Leukocytes, UA: NEGATIVE
Nitrite, UA: NEGATIVE

## 2012-09-09 MED ORDER — CEFTRIAXONE SODIUM 1 G IJ SOLR
1.0000 g | Freq: Once | INTRAMUSCULAR | Status: AC
  Administered 2012-09-09: 1 g via INTRAMUSCULAR

## 2012-09-09 NOTE — Progress Notes (Signed)
  Subjective:    Patient ID: Tyler [name], male    DOB: 30-Dec-1950, 62 y.o.   MRN: [id number]  HPI    Review of Systems  Constitutional: Positive for fever, chills, diaphoresis and fatigue.  Gastrointestinal: Positive for nausea and blood in stool.       Objective:   Physical Exam        Assessment & Plan:

## 2012-09-09 NOTE — Progress Notes (Signed)
Subjective:    Patient ID: Tyler [name], male    DOB: July 08, 1950, 62 y.o.   MRN: [id number]  HPI Here for cpe, has cough, chest congestion ,and fever.See w/up by Dr. Merla Riches with complete problem list. Has recent new hearing loss soon to be txed by ENT, possibly with high Dose steroids. Recent visit with Dr. Drue Dun proved no clotting disorder causing PE. Sinus tachy, COPD, Crohns and GI arthropayhy, Buergers and ischemic appendages, steroid osteopenia all an issue.   Review of Systems  Constitutional: Positive for fever, fatigue and unexpected weight change.  HENT: Positive for hearing loss, congestion, neck stiffness, sinus pressure and tinnitus.   Eyes: Positive for redness.  Respiratory: Positive for cough, chest tightness and shortness of breath.   Cardiovascular: Positive for palpitations.  Gastrointestinal: Positive for nausea and abdominal distention.  Endocrine: Negative.   Genitourinary: Negative.   Musculoskeletal: Positive for back pain and arthralgias.  Skin: Positive for rash.  Neurological: Positive for tremors and headaches.  Hematological: Bruises/bleeds easily.  Psychiatric/Behavioral: Positive for sleep disturbance and agitation.       Objective:   Physical Exam  Vitals reviewed. Constitutional: He is oriented to person, place, and time. He appears cachectic. He is cooperative. No distress.  HENT:  Right Ear: External ear normal.  Left Ear: External ear normal.  Nose: Nose normal.  Mouth/Throat: Oropharynx is clear and moist.  Eyes: EOM are normal. Pupils are equal, round, and reactive to light. Right eye exhibits no discharge. Left eye exhibits no discharge. No scleral icterus.  Neck: Normal range of motion. Neck supple. No tracheal deviation present. No thyromegaly present.  Cardiovascular: Regular rhythm, S1 normal and S2 normal.  Tachycardia present.  Exam reveals decreased pulses.   Pulmonary/Chest: Tachypnea noted. No respiratory distress. He has  decreased breath sounds. He has wheezes. He has rhonchi. He has no rales.  Abdominal: Bowel sounds are normal. He exhibits no mass. There is no hepatosplenomegaly. There is tenderness. There is no rigidity, no guarding and no CVA tenderness. A hernia is present. Hernia confirmed positive in the ventral area. Hernia confirmed negative in the right inguinal area and confirmed negative in the left inguinal area.    Many scars and hernias  Genitourinary: Rectum normal, testes normal and penis normal. Rectal exam shows no fissure and no mass. Circumcised. No penile tenderness. No discharge found.  Musculoskeletal: He exhibits tenderness.  Lymphadenopathy:    He has no cervical adenopathy.       Right: No inguinal adenopathy present.       Left: No inguinal adenopathy present.  Neurological: He is alert and oriented to person, place, and time. He exhibits normal muscle tone. Coordination normal.  Skin: Rash noted. There is erythema.  Psychiatric: He has a normal mood and affect.    Results for orders placed in visit on 09/09/12  POCT UA - MICROSCOPIC ONLY      Result Value Range   WBC, Ur, HPF, POC neg     RBC, urine, microscopic 0-6     Bacteria, U Microscopic neg     Mucus, UA small     Epithelial cells, urine per micros 0-7     Crystals, Ur, HPF, POC neg     Casts, Ur, LPF, POC neg     Yeast, UA neg    POCT URINALYSIS DIPSTICK      Result Value Range   Color, UA yellow     Clarity, UA clear     Glucose, UA neg  Bilirubin, UA neg     Ketones, UA neg     Spec Grav, UA 1.020     Blood, UA neg     pH, UA 5.5     Protein, UA neg     Urobilinogen, UA 0.2     Nitrite, UA neg     Leukocytes, UA Negative    IFOBT (OCCULT BLOOD)      Result Value Range   IFOBT Negative    POCT CBC      Result Value Range   WBC 9.3  4.6 - 10.2 K/uL   Lymph, poc 1.0  0.6 - 3.4   POC LYMPH PERCENT 10.9  10 - 50 %L   MID (cbc) 0.3  0 - 0.9   POC MID % 3.4  0 - 12 %M   POC Granulocyte 8.0 (*) 2 -  6.9   Granulocyte percent 85.7 (*) 37 - 80 %G   RBC 4.14 (*) 4.69 - 6.13 M/uL   Hemoglobin 12.3 (*) 14.1 - 18.1 g/dL   HCT, POC 04.5 (*) 40.9 - 53.7 %   MCV 96.6  80 - 97 fL   MCH, POC 29.7  27 - 31.2 pg   MCHC 30.8 (*) 31.8 - 35.4 g/dL   RDW, POC 81.1     Platelet Count, POC 174  142 - 424 K/uL   MPV 8.2  0 - 99.8 fL   EKG sinus tach      Assessment & Plan:  Rocephin 1g/See me at 102 in 3 days RF all meds 6 mos.

## 2012-09-11 ENCOUNTER — Encounter: Payer: Self-pay | Admitting: *Deleted

## 2012-09-12 ENCOUNTER — Ambulatory Visit (INDEPENDENT_AMBULATORY_CARE_PROVIDER_SITE_OTHER): Payer: Managed Care, Other (non HMO) | Admitting: Internal Medicine

## 2012-09-12 VITALS — BP 92/60 | HR 76 | Temp 97.8°F | Resp 20 | Ht 68.5 in | Wt 148.6 lb

## 2012-09-12 DIAGNOSIS — I10 Essential (primary) hypertension: Secondary | ICD-10-CM

## 2012-09-12 DIAGNOSIS — K5900 Constipation, unspecified: Secondary | ICD-10-CM

## 2012-09-12 DIAGNOSIS — R509 Fever, unspecified: Secondary | ICD-10-CM

## 2012-09-12 DIAGNOSIS — G4700 Insomnia, unspecified: Secondary | ICD-10-CM

## 2012-09-12 DIAGNOSIS — R05 Cough: Secondary | ICD-10-CM

## 2012-09-12 DIAGNOSIS — R059 Cough, unspecified: Secondary | ICD-10-CM

## 2012-09-12 DIAGNOSIS — M1990 Unspecified osteoarthritis, unspecified site: Secondary | ICD-10-CM

## 2012-09-12 DIAGNOSIS — Z7189 Other specified counseling: Secondary | ICD-10-CM

## 2012-09-12 DIAGNOSIS — I959 Hypotension, unspecified: Secondary | ICD-10-CM

## 2012-09-12 MED ORDER — METHYLPREDNISOLONE ACETATE 80 MG/ML IJ SUSP
120.0000 mg | Freq: Once | INTRAMUSCULAR | Status: AC
  Administered 2012-09-12: 120 mg via INTRAMUSCULAR

## 2012-09-12 MED ORDER — HYDROCODONE-ACETAMINOPHEN 7.5-325 MG PO TABS: 1.0000 | ORAL_TABLET | Freq: Three times a day (TID) | ORAL | Status: DC | PRN

## 2012-09-12 MED ORDER — ZOLPIDEM TARTRATE 10 MG PO TABS: 10.0000 mg | ORAL_TABLET | Freq: Every evening | ORAL | Status: DC | PRN

## 2012-09-12 MED ORDER — AZITHROMYCIN 500 MG PO TABS: 500.0000 mg | ORAL_TABLET | Freq: Every day | ORAL | Status: DC

## 2012-09-12 MED ORDER — ALBUTEROL SULFATE (2.5 MG/3ML) 0.083% IN NEBU: 2.5000 mg | INHALATION_SOLUTION | Freq: Four times a day (QID) | RESPIRATORY_TRACT | Status: AC | PRN

## 2012-09-12 MED ORDER — CEFTRIAXONE SODIUM 1 G IJ SOLR
1.0000 g | INTRAMUSCULAR | Status: AC
  Administered 2012-09-12: 1 g via INTRAMUSCULAR

## 2012-09-12 NOTE — Progress Notes (Signed)
  Subjective:    Patient ID: Tyler [name], male    DOB: 16-Sep-1950, 62 y.o.   MRN: [id number]  HPI Pt presents to clinic today to follow up from his Annual Physical from Monday. His BP and pulse are significantly better/low. Dr. Perrin Maltese is advising Pt to STOP norvsac.     Review of Systems     Objective:   Physical Exam        Assessment & Plan:

## 2012-09-12 NOTE — Patient Instructions (Addendum)
STOP YOUR NORVASC. Get Miralax over the counter and drink one glass twice a day until your bowels start moving smoothly. Then drop down to one glass of Miralax a day to help with constipation due to your pain medicines.

## 2012-09-12 NOTE — Progress Notes (Signed)
  Subjective:    Patient ID: Tyler [name], male    DOB: 09/13/1950, 63 y.o.   MRN: [id number]  HPI Fever and cough improved, less sob, still smokes some, BP 92/60 and will DC amlodipine 10mg . Continue metoprolol. Not sleeping and requests trial of Zolpidem. If works ok to [employer]. Needs pain med rfed. Went to ER in [place] for severe abd pain, ct dxed constipation. He is post bowel-ectomy from Crohns.   Review of Systems     Objective:   Physical Exam Clearly improved/Pulse 84 1st time normal range in months! Lungs louder wheezes and rhonchi Heart rr,nl Abdomen soft , tender LMquad, BS present     Assessment & Plan:  Fever/Cough zithromax 500mg  5d Abd pain/Constipation miralax bid Insomnia/chronic pain trial of zolpidem and rf hc DC amlodipine due to low BP.

## 2012-09-21 ENCOUNTER — Other Ambulatory Visit: Payer: Self-pay | Admitting: Physician Assistant

## 2012-10-20 ENCOUNTER — Ambulatory Visit (INDEPENDENT_AMBULATORY_CARE_PROVIDER_SITE_OTHER): Payer: Managed Care, Other (non HMO) | Admitting: Internal Medicine

## 2012-10-20 ENCOUNTER — Ambulatory Visit: Payer: Managed Care, Other (non HMO)

## 2012-10-20 VITALS — BP 114/76 | HR 106 | Temp 98.1°F | Resp 24 | Ht 68.0 in | Wt 136.2 lb

## 2012-10-20 DIAGNOSIS — Z7189 Other specified counseling: Secondary | ICD-10-CM

## 2012-10-20 DIAGNOSIS — K5090 Crohn's disease, unspecified, without complications: Secondary | ICD-10-CM

## 2012-10-20 DIAGNOSIS — M1990 Unspecified osteoarthritis, unspecified site: Secondary | ICD-10-CM

## 2012-10-20 DIAGNOSIS — R630 Anorexia: Secondary | ICD-10-CM

## 2012-10-20 DIAGNOSIS — R634 Abnormal weight loss: Secondary | ICD-10-CM

## 2012-10-20 DIAGNOSIS — K5900 Constipation, unspecified: Secondary | ICD-10-CM

## 2012-10-20 DIAGNOSIS — K639 Disease of intestine, unspecified: Secondary | ICD-10-CM

## 2012-10-20 DIAGNOSIS — I10 Essential (primary) hypertension: Secondary | ICD-10-CM

## 2012-10-20 DIAGNOSIS — G4700 Insomnia, unspecified: Secondary | ICD-10-CM

## 2012-10-20 DIAGNOSIS — M79609 Pain in unspecified limb: Secondary | ICD-10-CM

## 2012-10-20 DIAGNOSIS — R509 Fever, unspecified: Secondary | ICD-10-CM

## 2012-10-20 DIAGNOSIS — I959 Hypotension, unspecified: Secondary | ICD-10-CM

## 2012-10-20 DIAGNOSIS — R05 Cough: Secondary | ICD-10-CM

## 2012-10-20 DIAGNOSIS — J449 Chronic obstructive pulmonary disease, unspecified: Secondary | ICD-10-CM

## 2012-10-20 LAB — POCT CBC
HCT, POC: 41.6 % — AB (ref 43.5–53.7)
Hemoglobin: 13 g/dL — AB (ref 14.1–18.1)
Lymph, poc: 2 (ref 0.6–3.4)
MCH, POC: 29.7 pg (ref 27–31.2)
MCHC: 31.3 g/dL — AB (ref 31.8–35.4)
MCV: 95.1 fL (ref 80–97)
MPV: 7.8 fL (ref 0–99.8)
RBC: 4.37 M/uL — AB (ref 4.69–6.13)
WBC: 8.2 10*3/uL (ref 4.6–10.2)

## 2012-10-20 LAB — POCT SEDIMENTATION RATE: POCT SED RATE: 30 mm/hr — AB (ref 0–22)

## 2012-10-20 MED ORDER — METHYLPREDNISOLONE ACETATE 80 MG/ML IJ SUSP
80.0000 mg | Freq: Once | INTRAMUSCULAR | Status: AC
  Administered 2012-10-20: 80 mg via INTRAMUSCULAR

## 2012-10-20 MED ORDER — HYDROCODONE-ACETAMINOPHEN 7.5-325 MG PO TABS: 1.0000 | ORAL_TABLET | Freq: Three times a day (TID) | ORAL | Status: DC | PRN

## 2012-10-20 MED ORDER — PREDNISONE 10 MG PO TABS: ORAL_TABLET | ORAL | Status: DC

## 2012-10-20 NOTE — Progress Notes (Signed)
  Subjective:    Patient ID: Tyler [name], male    DOB: Jan 17, 1951, 62 y.o.   MRN: [id number]  HPI Patient has a 12 lb weight loss in the last month.  Patient has no appetite.  Patient states that he vomited Monday and Tuesday.    Patient states he is only smoking about a half a pack of cigarettes a day.   Patients hands are still very painful.   Patient is falling down a lot, but denies any dizziness or lightheadedness during these episodes.  Patient does not believe he is passing out, just falling. Dr. Jules Husbands w/up was negative. Crohns not active now on meds. He requests prednisone today, wants to try working again.   Review of Systems     Objective:   Physical Exam  Constitutional: He is oriented to person, place, and time. He appears cachectic. He is active and cooperative.  Cardiovascular: Regular rhythm, S1 normal, S2 normal and normal pulses.  Tachycardia present.   Pulmonary/Chest: Effort normal. Not tachypneic. No respiratory distress. He has decreased breath sounds. He has wheezes. He has no rales.  Abdominal: Soft. Bowel sounds are normal. He exhibits no distension and no mass.  Musculoskeletal: He exhibits tenderness.  Neurological: He is alert and oriented to person, place, and time. He exhibits normal muscle tone. Coordination normal.  Skin: No rash noted.  Psychiatric: He has a normal mood and affect. His behavior is normal. Thought content normal.   [facility] reading (PRIMARY) by  Dr Perrin Maltese no mass seen, copd Results for orders placed in visit on 10/20/12  POCT CBC      Result Value Range   WBC 8.2  4.6 - 10.2 K/uL   Lymph, poc 2.0  0.6 - 3.4   POC LYMPH PERCENT 24.7  10 - 50 %L   MID (cbc) 0.8  0 - 0.9   POC MID % 9.7  0 - 12 %M   POC Granulocyte 5.4  2 - 6.9   Granulocyte percent 65.6  37 - 80 %G   RBC 4.37 (*) 4.69 - 6.13 M/uL   Hemoglobin 13.0 (*) 14.1 - 18.1 g/dL   HCT, POC 40.9 (*) 81.1 - 53.7 %   MCV 95.1  80 - 97 fL   MCH, POC 29.7  27 - 31.2 pg   MCHC  31.3 (*) 31.8 - 35.4 g/dL   RDW, POC 91.4     Platelet Count, POC 287  142 - 424 K/uL   MPV 7.8  0 - 99.8 fL  GLUCOSE, POCT (MANUAL RESULT ENTRY)      Result Value Range   POC Glucose 94  70 - 99 mg/dl  POCT GLYCOSYLATED HEMOGLOBIN (HGB A1C)      Result Value Range   Hemoglobin A1C 4.9              Assessment & Plan:  Use carnation instant breakfast daily.  Patient needs to see his GI Dr about his weight loss. Dr. Christella Hartigan

## 2012-10-20 NOTE — Patient Instructions (Addendum)
Smoking and Your Digestive System Cigarette smoking causes many life-threatening diseases. These include lung cancer, other cancers, emphysema, and heart disease. About 430,000 deaths each year are directly caused by cigarette smoking. Smoking results in disease-causing changes in all parts of the body. This includes the digestive system. This can cause serious effects, since the digestive system converts foods into nutrients the body needs to live. Smoking has been shown to have harmful effects on all parts of the digestive system. It adds to common disorders, such as heartburn and peptic ulcers. It also increases the risk of Crohn's disease, and possibly gallstones. Smoking seems to affect the liver by changing the way it handles drugs and alcohol and removes them. In fact, there seems to be enough evidence to stop smoking based solely on digestive distress. Some of the harmful effects of smoking are:  Heartburn (acid reflux).  Heartburn happens when acidic juices from the stomach splash into the esophagus, which has a more sensitive and less acid-resistant lining than the stomach. Normally, a muscular valve at the lower end of the esophagus keeps out the acid solution in the stomach. Smoking decreases the strength of the esophageal valve and its ability to keep out acidic stomach contents. This allows stomach acid reflux, or flow backward into the esophagus.  Smoking also seems to promote the movement of bile salts from the intestine to the stomach. This makes stomach acids more harmful.  A peptic ulcer is an open sore in the lining of the stomach or duodenum (first part of the small intestine). The exact cause of ulcers is not known. A link between smoking cigarettes and ulcers, especially duodenal ulcers, does exist. Ulcers are more likely to occur, less likely to heal, and more likely to cause death in smokers than in nonsmokers.  Some research suggests that smoking might increase a person's risk of  infection with the bacterium Helicobacter pylori (H. pylori). Most peptic ulcers are caused by this bacterium.  Stomach acid is also important in causing ulcers. Normally, most of this acid is buffered (neutralized) by the food we eat. Most of the unbuffered acid that enters the duodenum is quickly neutralized by sodium bicarbonate. This is a naturally occurring alkali, produced by the pancreas. Some studies show that smoking reduces the bicarbonate produced by the pancreas. This interferes with the neutralization of acid in the duodenum. Other studies suggest that chronic cigarette smoking may increase the amount of acid produced by the stomach.  Whatever causes the link between smoking and ulcers, two points have been repeatedly shown. People who smoke are more likely to develop an ulcer, especially a duodenal ulcer. Ulcers in smokers are less likely to heal quickly in response to otherwise effective treatment. This research strongly suggests that a person with an ulcer should stop smoking.  The liver is an important organ with many tasks. One task of the liver is to prepare drugs, alcohol, and other toxins for elimination (removal) from the body. There is evidence that smoking alters the ability of the liver to effectively handle such substances. In some cases, this may influence the dose of medicine needed to treat an illness. Some research suggests that smoking can aggravate and speed up the course of liver disease caused by excessive alcohol intake.  Studies have shown that smokers have weaker or less frequent stomach contractions while smoking, which can cause less efficient digestion.  Crohn's disease causes inflammation deep in the lining of the intestine. The disease causes pain and diarrhea. It usually  affects the small intestine, but it can occur anywhere in the digestive tract. Research shows that current and former smokers have a higher risk of developing Crohn's disease than nonsmokers.  Among people with the disease, smoking is linked with a higher rate of relapse, repeat surgery, and immunosuppressive treatment. In all areas, the risk for women who are current or former smokers is slightly higher than for men. Why smoking increases the risk of Crohn's disease is unknown.  Several studies suggest that smoking may increase the risk of developing gallstones. The risk may be higher for women. Research results on this topic are not consistent. More studies are needed.  Oral (lip and mouth) cancer and cancer of the pharynx (throat) and the esophagus are caused by smoking. Smoking may be associated with pancreatic cancer.  Some of the effects of smoking on the digestive system seem to be short-lived. For example, the effect of smoking on bicarbonate production by the pancreas does not appear to last. Half an hour after smoking, the production of bicarbonate returns to normal. The effects of smoking on how the liver handles drugs also disappear when a person stops smoking. However, people who no longer smoke still remain at risk for Crohn's disease. Document Released: 05/18/2004 Document Revised: 08/28/2011 Document Reviewed: 03/22/2009 [facility] Patient Information 2013 [place], [place].

## 2012-10-20 NOTE — Progress Notes (Signed)
  Subjective:    Patient ID: Tyler [name], male    DOB: 1951-06-01, 62 y.o.   MRN: [id number]  HPI    Review of Systems     Objective:   Physical Exam        Assessment & Plan:

## 2012-10-21 LAB — BASIC METABOLIC PANEL
CO2: 28 mEq/L (ref 19–32)
Calcium: 9 mg/dL (ref 8.4–10.5)
Chloride: 99 mEq/L (ref 96–112)
Potassium: 3.5 mEq/L (ref 3.5–5.3)
Sodium: 135 mEq/L (ref 135–145)

## 2012-10-23 ENCOUNTER — Other Ambulatory Visit: Payer: Self-pay | Admitting: Physician Assistant

## 2012-10-23 ENCOUNTER — Encounter: Payer: Self-pay | Admitting: *Deleted

## 2012-10-28 ENCOUNTER — Encounter: Payer: Managed Care, Other (non HMO) | Admitting: Internal Medicine

## 2012-11-14 ENCOUNTER — Ambulatory Visit (INDEPENDENT_AMBULATORY_CARE_PROVIDER_SITE_OTHER): Payer: Managed Care, Other (non HMO) | Admitting: Internal Medicine

## 2012-11-14 ENCOUNTER — Ambulatory Visit: Payer: Managed Care, Other (non HMO)

## 2012-11-14 VITALS — BP 108/74 | HR 93 | Temp 98.0°F | Resp 17 | Ht 68.5 in | Wt 140.0 lb

## 2012-11-14 DIAGNOSIS — Z7189 Other specified counseling: Secondary | ICD-10-CM

## 2012-11-14 DIAGNOSIS — M545 Low back pain, unspecified: Secondary | ICD-10-CM

## 2012-11-14 DIAGNOSIS — IMO0001 Reserved for inherently not codable concepts without codable children: Secondary | ICD-10-CM

## 2012-11-14 DIAGNOSIS — M1990 Unspecified osteoarthritis, unspecified site: Secondary | ICD-10-CM

## 2012-11-14 DIAGNOSIS — J449 Chronic obstructive pulmonary disease, unspecified: Secondary | ICD-10-CM

## 2012-11-14 DIAGNOSIS — R634 Abnormal weight loss: Secondary | ICD-10-CM

## 2012-11-14 DIAGNOSIS — I10 Essential (primary) hypertension: Secondary | ICD-10-CM

## 2012-11-14 DIAGNOSIS — R509 Fever, unspecified: Secondary | ICD-10-CM

## 2012-11-14 DIAGNOSIS — R630 Anorexia: Secondary | ICD-10-CM

## 2012-11-14 DIAGNOSIS — R05 Cough: Secondary | ICD-10-CM

## 2012-11-14 DIAGNOSIS — W19XXXA Unspecified fall, initial encounter: Secondary | ICD-10-CM

## 2012-11-14 DIAGNOSIS — T1490XA Injury, unspecified, initial encounter: Secondary | ICD-10-CM

## 2012-11-14 DIAGNOSIS — K5900 Constipation, unspecified: Secondary | ICD-10-CM

## 2012-11-14 DIAGNOSIS — R059 Cough, unspecified: Secondary | ICD-10-CM

## 2012-11-14 DIAGNOSIS — I959 Hypotension, unspecified: Secondary | ICD-10-CM

## 2012-11-14 DIAGNOSIS — G4700 Insomnia, unspecified: Secondary | ICD-10-CM

## 2012-11-14 DIAGNOSIS — K639 Disease of intestine, unspecified: Secondary | ICD-10-CM

## 2012-11-14 MED ORDER — HYDROCODONE-ACETAMINOPHEN 7.5-325 MG PO TABS: 1.0000 | ORAL_TABLET | Freq: Three times a day (TID) | ORAL | Status: DC | PRN

## 2012-11-14 NOTE — Patient Instructions (Addendum)

## 2012-11-14 NOTE — Progress Notes (Signed)
  Subjective:    Patient ID: Tyler [name], male    DOB: 17-Mar-1951, 62 y.o.   MRN: [id number]  HPIpatient fell last Friday injury to lower back. Patient bruised left shoulder. No radiation down Legs, no incontinence, or numbness.Have stopped all bp meds, but needs metoprolol to protect heart.  Carnation instant breakfast is giving him strenth and weight. Up 5 lbs. Review of Systems complex    Objective:   Physical Exam  Constitutional: He is oriented to person, place, and time. He appears cachectic. He is active. He does not have a sickly appearance.  Eyes: No scleral icterus.  Cardiovascular: Normal rate and regular rhythm.   Pulmonary/Chest: Effort normal. He has decreased breath sounds.  Abdominal: Soft. There is no tenderness.  Neurological: He is alert and oriented to person, place, and time. He exhibits normal muscle tone. Coordination normal.  Skin: Rash noted.  Psychiatric: He has a normal mood and affect.   Moves well, painful to get up and down from chair. No weakness or numbness. Eccymosis on arms  [facility] reading (PRIMARY) by  Dr.Briell [name] no fx seen.       Assessment & Plan:  LBP Start vitamin D 4000 units/d and One a day senior vitamin RF HC RTC if not well 1-2 weeks. Stop one of night time sleep aids to prevent falling

## 2012-11-14 NOTE — Progress Notes (Signed)
  Subjective:    Patient ID: Tyler [name], male    DOB: 04-01-1951, 62 y.o.   MRN: [id number]  HPI Has gained 5lbs.   Review of Systems     Objective:   Physical Exam        Assessment & Plan:

## 2012-11-28 ENCOUNTER — Other Ambulatory Visit: Payer: Self-pay | Admitting: Internal Medicine

## 2012-12-13 ENCOUNTER — Ambulatory Visit (INDEPENDENT_AMBULATORY_CARE_PROVIDER_SITE_OTHER): Payer: Managed Care, Other (non HMO) | Admitting: Internal Medicine

## 2012-12-13 VITALS — BP 126/80 | HR 116 | Temp 97.9°F | Resp 16 | Ht 69.0 in | Wt 142.0 lb

## 2012-12-13 DIAGNOSIS — R630 Anorexia: Secondary | ICD-10-CM

## 2012-12-13 DIAGNOSIS — R509 Fever, unspecified: Secondary | ICD-10-CM

## 2012-12-13 DIAGNOSIS — R635 Abnormal weight gain: Secondary | ICD-10-CM

## 2012-12-13 DIAGNOSIS — R259 Unspecified abnormal involuntary movements: Secondary | ICD-10-CM

## 2012-12-13 DIAGNOSIS — G4700 Insomnia, unspecified: Secondary | ICD-10-CM

## 2012-12-13 DIAGNOSIS — M25579 Pain in unspecified ankle and joints of unspecified foot: Secondary | ICD-10-CM

## 2012-12-13 DIAGNOSIS — J449 Chronic obstructive pulmonary disease, unspecified: Secondary | ICD-10-CM

## 2012-12-13 DIAGNOSIS — E538 Deficiency of other specified B group vitamins: Secondary | ICD-10-CM

## 2012-12-13 DIAGNOSIS — R05 Cough: Secondary | ICD-10-CM

## 2012-12-13 DIAGNOSIS — M1990 Unspecified osteoarthritis, unspecified site: Secondary | ICD-10-CM

## 2012-12-13 DIAGNOSIS — I10 Essential (primary) hypertension: Secondary | ICD-10-CM

## 2012-12-13 DIAGNOSIS — I959 Hypotension, unspecified: Secondary | ICD-10-CM

## 2012-12-13 DIAGNOSIS — R251 Tremor, unspecified: Secondary | ICD-10-CM

## 2012-12-13 DIAGNOSIS — K639 Disease of intestine, unspecified: Secondary | ICD-10-CM

## 2012-12-13 DIAGNOSIS — K5900 Constipation, unspecified: Secondary | ICD-10-CM

## 2012-12-13 DIAGNOSIS — R634 Abnormal weight loss: Secondary | ICD-10-CM

## 2012-12-13 DIAGNOSIS — Z7189 Other specified counseling: Secondary | ICD-10-CM

## 2012-12-13 LAB — POCT CBC
Granulocyte percent: 63.4 %G (ref 37–80)
Hemoglobin: 12.7 g/dL — AB (ref 14.1–18.1)
Lymph, poc: 3.1 (ref 0.6–3.4)
MCHC: 30.6 g/dL — AB (ref 31.8–35.4)
MPV: 7.2 fL (ref 0–99.8)
POC Granulocyte: 6.6 (ref 2–6.9)
POC MID %: 7.2 %M (ref 0–12)
RBC: 4.03 M/uL — AB (ref 4.69–6.13)

## 2012-12-13 LAB — TSH: TSH: 1.753 u[IU]/mL (ref 0.350–4.500)

## 2012-12-13 MED ORDER — HYDROCODONE-ACETAMINOPHEN 7.5-325 MG PO TABS: 1.0000 | ORAL_TABLET | Freq: Three times a day (TID) | ORAL | Status: DC | PRN

## 2012-12-13 MED ORDER — TEMAZEPAM 30 MG PO CAPS: 30.0000 mg | ORAL_CAPSULE | Freq: Every evening | ORAL | Status: DC | PRN

## 2012-12-13 MED ORDER — METHYLPREDNISOLONE ACETATE 80 MG/ML IJ SUSP
120.0000 mg | Freq: Once | INTRAMUSCULAR | Status: AC
  Administered 2012-12-13: 120 mg via INTRAMUSCULAR

## 2012-12-13 MED ORDER — CYANOCOBALAMIN 1000 MCG/ML IJ SOLN
1000.0000 ug | Freq: Once | INTRAMUSCULAR | Status: AC
  Administered 2012-12-13: 1000 ug via INTRAMUSCULAR

## 2012-12-13 MED ORDER — PREDNISONE 10 MG PO TABS: ORAL_TABLET | ORAL | Status: DC

## 2012-12-13 NOTE — Progress Notes (Signed)
  Subjective:    Patient ID: Tyler [name], male    DOB: 02-27-1951, 62 y.o.   MRN: [id number]  HPI  Patient here today for follow up with Dr. Perrin Maltese  His weight is up two pounds from 11/14/12 visit. Needs rfs, going on vacation. Has new tremor, fairly new stinging in feet. Ran out of restoril and HC and prednisone. Is using prednisone sparingly and intermittently. Has crohns with surgery hx. 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   Review of Systems     Objective:   Physical Exam  Vitals reviewed. Constitutional: He is oriented to person, place, and time. No distress.  Cardiovascular:  No murmur heard. Pulmonary/Chest: Effort normal and breath sounds normal.  Abdominal: He exhibits distension. He exhibits no mass. There is tenderness. There is no rebound and no guarding. A hernia is present. Hernia confirmed positive in the ventral area.  Musculoskeletal: Normal range of motion. He exhibits tenderness.       Left foot: He exhibits decreased capillary refill. He exhibits normal range of motion, no tenderness, no bony tenderness, no swelling and no crepitus.  Pulses are strong  Neurological: He is alert and oriented to person, place, and time. He exhibits normal muscle tone. Coordination normal.  Skin: No rash noted.  Psychiatric: He has a normal mood and affect.  Mild cachexia, improve. Gross tremor worse.Beefy red tongue  Cbc/b12/tsh      Assessment & Plan:  Foot pain right less than left/possible neuropathic Tremor progressive/Trial B12 IM/Depomedrol IM his request RF meds Print narcotic hx on next visit

## 2012-12-13 NOTE — Patient Instructions (Signed)
Tremor  Tremor is a rhythmic, involuntary muscular contraction characterized by oscillations (to-and-fro movements) of a part of the body. The most common of all involuntary movements, tremor can affect various body parts such as the hands, head, facial structures, vocal cords, trunk, and legs; most tremors, however, occur in the hands. Tremor often accompanies neurological disorders associated with aging. Although the disorder is not life-threatening, it can be responsible for functional disability and social embarrassment.  TREATMENT   There are many types of tremor and several ways in which tremor is classified. The most common classification is by behavioral context or position. There are five categories of tremor within this classification: resting, postural, kinetic, task-specific, and psychogenic. Resting or static tremor occurs when the muscle is at rest, for example when the hands are lying on the lap. This type of tremor is often seen in patients with Parkinson's disease. Postural tremor occurs when a patient attempts to maintain posture, such as holding the hands outstretched. Postural tremors include physiological tremor, essential tremor, tremor with basal ganglia disease (also seen in patients with Parkinson's disease), cerebellar postural tremor, tremor with peripheral neuropathy, post-traumatic tremor, and alcoholic tremor. Kinetic or intention (action) tremor occurs during purposeful movement, for example during finger-to-nose testing. Task-specific tremor appears when performing goal-oriented tasks such as handwriting, speaking, or standing. This group consists of primary writing tremor, vocal tremor, and orthostatic tremor. Psychogenic tremor occurs in both older and younger patients. The key feature of this tremor is that it dramatically lessens or disappears when the patient is distracted.  PROGNOSIS  There are some treatment options available for tremor; the appropriate treatment depends on  accurate diagnosis of the cause. Some tremors respond to treatment of the underlying condition, for example in some cases of psychogenic tremor treating the patient's underlying mental problem may cause the tremor to disappear. Also, patients with tremor due to Parkinson's disease may be treated with Levodopa drug therapy. Symptomatic drug therapy is available for several other tremors as well. For those cases of tremor in which there is no effective drug treatment, physical measures such as teaching the patient to brace the affected limb during the tremor are sometimes useful. Surgical intervention such as thalamotomy or deep brain stimulation may be useful in certain cases.  Document Released: 05/26/2002 Document Revised: 08/28/2011 Document Reviewed: 06/05/2005  [employer]® Patient Information ©2014 [employer].

## 2012-12-17 ENCOUNTER — Other Ambulatory Visit: Payer: Self-pay | Admitting: Internal Medicine

## 2012-12-17 ENCOUNTER — Telehealth: Payer: Self-pay

## 2012-12-17 MED ORDER — AMITRIPTYLINE HCL 150 MG PO TABS: ORAL_TABLET | ORAL | Status: DC

## 2012-12-17 NOTE — Telephone Encounter (Signed)
Pt states that he will be going out of town in the morning and did not realize that he would be out of amitriptyline and now needs this filled before he leaves in the morning. [phone]

## 2012-12-17 NOTE — Telephone Encounter (Signed)
Pended please advise.  

## 2012-12-17 NOTE — Telephone Encounter (Signed)
Meds ordered this encounter  Medications  . amitriptyline (ELAVIL) 150 MG tablet    Sig: TAKE 1 TABLET BY MOUTH AT BEDTIME    Dispense:  30 tablet    Refill:  3

## 2013-01-14 ENCOUNTER — Other Ambulatory Visit: Payer: Self-pay | Admitting: Internal Medicine

## 2013-01-14 NOTE — Telephone Encounter (Signed)
Forward to Dr. Doolittle 

## 2013-01-15 ENCOUNTER — Other Ambulatory Visit: Payer: Self-pay | Admitting: Internal Medicine

## 2013-01-16 NOTE — Telephone Encounter (Signed)
Ok refill 6 months 

## 2013-01-30 ENCOUNTER — Ambulatory Visit (INDEPENDENT_AMBULATORY_CARE_PROVIDER_SITE_OTHER): Payer: Managed Care, Other (non HMO) | Admitting: Internal Medicine

## 2013-01-30 VITALS — BP 132/82 | HR 106 | Temp 98.0°F | Resp 17 | Ht 69.0 in | Wt 159.0 lb

## 2013-01-30 DIAGNOSIS — R251 Tremor, unspecified: Secondary | ICD-10-CM | POA: Insufficient documentation

## 2013-01-30 DIAGNOSIS — W19XXXA Unspecified fall, initial encounter: Secondary | ICD-10-CM

## 2013-01-30 DIAGNOSIS — R531 Weakness: Secondary | ICD-10-CM

## 2013-01-30 DIAGNOSIS — S42001A Fracture of unspecified part of right clavicle, initial encounter for closed fracture: Secondary | ICD-10-CM

## 2013-01-30 DIAGNOSIS — M810 Age-related osteoporosis without current pathological fracture: Secondary | ICD-10-CM

## 2013-01-30 DIAGNOSIS — R5383 Other fatigue: Secondary | ICD-10-CM

## 2013-01-30 DIAGNOSIS — S42009A Fracture of unspecified part of unspecified clavicle, initial encounter for closed fracture: Secondary | ICD-10-CM

## 2013-01-30 DIAGNOSIS — E441 Mild protein-calorie malnutrition: Secondary | ICD-10-CM | POA: Insufficient documentation

## 2013-01-30 DIAGNOSIS — R259 Unspecified abnormal involuntary movements: Secondary | ICD-10-CM

## 2013-01-30 DIAGNOSIS — M6250 Muscle wasting and atrophy, not elsewhere classified, unspecified site: Secondary | ICD-10-CM | POA: Insufficient documentation

## 2013-01-30 LAB — POCT CBC
MCH, POC: 32.9 pg — AB (ref 27–31.2)
MCHC: 31.5 g/dL — AB (ref 31.8–35.4)
MCV: 104.7 fL — AB (ref 80–97)
MID (cbc): 0.7 (ref 0–0.9)
MPV: 6.5 fL (ref 0–99.8)
POC LYMPH PERCENT: 21.9 %L (ref 10–50)
POC MID %: 8.7 %M (ref 0–12)
Platelet Count, POC: 174 10*3/uL (ref 142–424)
RDW, POC: 15.7 %
WBC: 8.4 10*3/uL (ref 4.6–10.2)

## 2013-01-30 MED ORDER — OXYCODONE-ACETAMINOPHEN 10-325 MG PO TABS: 1.0000 | ORAL_TABLET | Freq: Three times a day (TID) | ORAL | Status: DC | PRN

## 2013-01-30 MED ORDER — CYANOCOBALAMIN 1000 MCG/ML IJ SOLN
1000.0000 ug | Freq: Once | INTRAMUSCULAR | Status: AC
  Administered 2013-01-30: 1000 ug via INTRAMUSCULAR

## 2013-01-30 NOTE — Progress Notes (Signed)
  Subjective:    Patient ID: Tyler [name], male    DOB: Oct 01, 1950, 62 y.o.   MRN: [id number]  HPI Fell/tripped at home and broke   Review of Systems     Objective:   Physical Exam        Assessment & Plan:  Refer to ortho for fxs Refer to endocrine for r/o addisons and for osteoporosis Improve nutrtrition/Vitamins/B12 1cc im now.

## 2013-01-30 NOTE — Patient Instructions (Addendum)
Tremor  Tremor is a rhythmic, involuntary muscular contraction characterized by oscillations (to-and-fro movements) of a part of the body. The most common of all involuntary movements, tremor can affect various body parts such as the hands, head, facial structures, vocal cords, trunk, and legs; most tremors, however, occur in the hands. Tremor often accompanies neurological disorders associated with aging. Although the disorder is not life-threatening, it can be responsible for functional disability and social embarrassment.  TREATMENT   There are many types of tremor and several ways in which tremor is classified. The most common classification is by behavioral context or position. There are five categories of tremor within this classification: resting, postural, kinetic, task-specific, and psychogenic. Resting or static tremor occurs when the muscle is at rest, for example when the hands are lying on the lap. This type of tremor is often seen in patients with Parkinson's disease. Postural tremor occurs when a patient attempts to maintain posture, such as holding the hands outstretched. Postural tremors include physiological tremor, essential tremor, tremor with basal ganglia disease (also seen in patients with Parkinson's disease), cerebellar postural tremor, tremor with peripheral neuropathy, post-traumatic tremor, and alcoholic tremor. Kinetic or intention (action) tremor occurs during purposeful movement, for example during finger-to-nose testing. Task-specific tremor appears when performing goal-oriented tasks such as handwriting, speaking, or standing. This group consists of primary writing tremor, vocal tremor, and orthostatic tremor. Psychogenic tremor occurs in both older and younger patients. The key feature of this tremor is that it dramatically lessens or disappears when the patient is distracted.  PROGNOSIS  There are some treatment options available for tremor; the appropriate treatment depends on  accurate diagnosis of the cause. Some tremors respond to treatment of the underlying condition, for example in some cases of psychogenic tremor treating the patient's underlying mental problem may cause the tremor to disappear. Also, patients with tremor due to Parkinson's disease may be treated with Levodopa drug therapy. Symptomatic drug therapy is available for several other tremors as well. For those cases of tremor in which there is no effective drug treatment, physical measures such as teaching the patient to brace the affected limb during the tremor are sometimes useful. Surgical intervention such as thalamotomy or deep brain stimulation may be useful in certain cases.  Document Released: 05/26/2002 Document Revised: 08/28/2011 Document Reviewed: 06/05/2005  [employer]® Patient Information ©2014 [employer].

## 2013-01-30 NOTE — Progress Notes (Signed)
  Subjective:    Patient ID: Tyler [name], male    DOB: 04/09/1951, 62 y.o.   MRN: [id number]  HPI  62 y.o male here for recheck for left arm and collar bone since fall on Monday night. Started shaking before falling in the carport at his home; felt like he lost control of his body. Was seen at [facility]. After x-rays he was informed he has a broken collar bone. Back is continuing to bother him.  Is still continuing to have shaking. States that is has neither got worse nor better. Tripped over weed eater when broke collar bone. Muscle wastin, tremors, weakness worse, steroid dependence.      Review of Systems crohns disese post surgery    Objective:   Physical Exam  Vitals reviewed. Constitutional: He is oriented to person, place, and time. He appears cachectic. He is cooperative. He has a sickly appearance. No distress.  HENT:  Head: Normocephalic.  Eyes: EOM are normal. No scleral icterus.  Neck: Normal range of motion.  Cardiovascular: Regular rhythm and S1 normal.  Tachycardia present.   Pulmonary/Chest: Effort normal. Not tachypneic. No respiratory distress. He has rhonchi.  Abdominal: He exhibits distension. There is no tenderness.  Musculoskeletal: He exhibits tenderness.  Neurological: He is alert and oriented to person, place, and time. He displays atrophy. No sensory deficit. Gait normal.  Tremor normal TSH  Skin: Skin is intact. Bruising and ecchymosis noted.     eccymosis from fxs    Results for orders placed in visit on 01/30/13  POCT CBC      Result Value Range   WBC 8.4  4.6 - 10.2 K/uL   Lymph, poc 1.8  0.6 - 3.4   POC LYMPH PERCENT 21.9  10 - 50 %L   MID (cbc) 0.7  0 - 0.9   POC MID % 8.7  0 - 12 %M   POC Granulocyte 5.8  2 - 6.9   Granulocyte percent 69.4  37 - 80 %G   RBC 3.28 (*) 4.69 - 6.13 M/uL   Hemoglobin 10.8 (*) 14.1 - 18.1 g/dL   HCT, POC 82.9 (*) 56.2 - 53.7 %   MCV 104.7 (*) 80 - 97 fL   MCH, POC 32.9 (*) 27 - 31.2 pg   MCHC  31.5 (*) 31.8 - 35.4 g/dL   RDW, POC 13.0     Platelet Count, POC 174  142 - 424 K/uL   MPV 6.5  0 - 99.8 fL   New anemia      Assessment & Plan:  Refer to endocrine and orthopedics B12 1cc im Nutritional supplements/consult May need gait training and PT Repeat cbc 1 month

## 2013-01-31 ENCOUNTER — Encounter: Payer: Self-pay | Admitting: Family Medicine

## 2013-01-31 LAB — COMPREHENSIVE METABOLIC PANEL
ALT: 19 U/L (ref 0–53)
AST: 27 U/L (ref 0–37)
CO2: 28 mEq/L (ref 19–32)
Calcium: 8.5 mg/dL (ref 8.4–10.5)
Chloride: 104 mEq/L (ref 96–112)
Creat: 0.77 mg/dL (ref 0.50–1.35)
Potassium: 3.9 mEq/L (ref 3.5–5.3)
Sodium: 138 mEq/L (ref 135–145)
Total Protein: 5 g/dL — ABNORMAL LOW (ref 6.0–8.3)

## 2013-02-04 ENCOUNTER — Other Ambulatory Visit: Payer: Self-pay | Admitting: Internal Medicine

## 2013-02-18 ENCOUNTER — Other Ambulatory Visit: Payer: Self-pay | Admitting: Internal Medicine

## 2013-02-21 ENCOUNTER — Ambulatory Visit (INDEPENDENT_AMBULATORY_CARE_PROVIDER_SITE_OTHER): Payer: Managed Care, Other (non HMO) | Admitting: Internal Medicine

## 2013-02-21 VITALS — BP 142/86 | Temp 98.1°F | Resp 18 | Ht 69.0 in | Wt 145.8 lb

## 2013-02-21 DIAGNOSIS — R319 Hematuria, unspecified: Secondary | ICD-10-CM

## 2013-02-21 DIAGNOSIS — K5289 Other specified noninfective gastroenteritis and colitis: Secondary | ICD-10-CM

## 2013-02-21 DIAGNOSIS — R05 Cough: Secondary | ICD-10-CM

## 2013-02-21 DIAGNOSIS — R000 Tachycardia, unspecified: Secondary | ICD-10-CM

## 2013-02-21 DIAGNOSIS — R059 Cough, unspecified: Secondary | ICD-10-CM

## 2013-02-21 DIAGNOSIS — J449 Chronic obstructive pulmonary disease, unspecified: Secondary | ICD-10-CM

## 2013-02-21 DIAGNOSIS — N390 Urinary tract infection, site not specified: Secondary | ICD-10-CM

## 2013-02-21 DIAGNOSIS — S0180XA Unspecified open wound of other part of head, initial encounter: Secondary | ICD-10-CM

## 2013-02-21 DIAGNOSIS — K5900 Constipation, unspecified: Secondary | ICD-10-CM

## 2013-02-21 DIAGNOSIS — J4489 Other specified chronic obstructive pulmonary disease: Secondary | ICD-10-CM

## 2013-02-21 DIAGNOSIS — K5090 Crohn's disease, unspecified, without complications: Secondary | ICD-10-CM

## 2013-02-21 DIAGNOSIS — G4700 Insomnia, unspecified: Secondary | ICD-10-CM

## 2013-02-21 DIAGNOSIS — E860 Dehydration: Secondary | ICD-10-CM

## 2013-02-21 DIAGNOSIS — I998 Other disorder of circulatory system: Secondary | ICD-10-CM

## 2013-02-21 DIAGNOSIS — M129 Arthropathy, unspecified: Secondary | ICD-10-CM

## 2013-02-21 DIAGNOSIS — R630 Anorexia: Secondary | ICD-10-CM

## 2013-02-21 DIAGNOSIS — I999 Unspecified disorder of circulatory system: Secondary | ICD-10-CM

## 2013-02-21 DIAGNOSIS — R509 Fever, unspecified: Secondary | ICD-10-CM

## 2013-02-21 DIAGNOSIS — R634 Abnormal weight loss: Secondary | ICD-10-CM

## 2013-02-21 DIAGNOSIS — M79609 Pain in unspecified limb: Secondary | ICD-10-CM

## 2013-02-21 DIAGNOSIS — R197 Diarrhea, unspecified: Secondary | ICD-10-CM

## 2013-02-21 DIAGNOSIS — IMO0001 Reserved for inherently not codable concepts without codable children: Secondary | ICD-10-CM

## 2013-02-21 DIAGNOSIS — R1110 Vomiting, unspecified: Secondary | ICD-10-CM

## 2013-02-21 DIAGNOSIS — I10 Essential (primary) hypertension: Secondary | ICD-10-CM

## 2013-02-21 DIAGNOSIS — K639 Disease of intestine, unspecified: Secondary | ICD-10-CM

## 2013-02-21 DIAGNOSIS — M1990 Unspecified osteoarthritis, unspecified site: Secondary | ICD-10-CM

## 2013-02-21 DIAGNOSIS — I959 Hypotension, unspecified: Secondary | ICD-10-CM

## 2013-02-21 DIAGNOSIS — Z7189 Other specified counseling: Secondary | ICD-10-CM

## 2013-02-21 DIAGNOSIS — E538 Deficiency of other specified B group vitamins: Secondary | ICD-10-CM

## 2013-02-21 LAB — POCT CBC
Granulocyte percent: 74.3 %G (ref 37–80)
Hemoglobin: 13.1 g/dL — AB (ref 14.1–18.1)
MCV: 101.2 fL — AB (ref 80–97)
MID (cbc): 1.2 — AB (ref 0–0.9)
MPV: 7.5 fL (ref 0–99.8)
Platelet Count, POC: 232 10*3/uL (ref 142–424)
RBC: 4.11 M/uL — AB (ref 4.69–6.13)
WBC: 13.4 10*3/uL — AB (ref 4.6–10.2)

## 2013-02-21 LAB — BASIC METABOLIC PANEL
CO2: 29 mEq/L (ref 19–32)
Calcium: 8.6 mg/dL (ref 8.4–10.5)
Glucose, Bld: 88 mg/dL (ref 70–99)
Sodium: 138 mEq/L (ref 135–145)

## 2013-02-21 LAB — POCT URINALYSIS DIPSTICK
Ketones, UA: 15
Spec Grav, UA: 1.02

## 2013-02-21 LAB — POCT UA - MICROSCOPIC ONLY

## 2013-02-21 MED ORDER — HYDROCODONE-ACETAMINOPHEN 7.5-325 MG PO TABS: 1.0000 | ORAL_TABLET | Freq: Three times a day (TID) | ORAL | Status: DC | PRN

## 2013-02-21 MED ORDER — CIPROFLOXACIN HCL 500 MG PO TABS: 500.0000 mg | ORAL_TABLET | Freq: Two times a day (BID) | ORAL | Status: DC

## 2013-02-21 MED ORDER — ONDANSETRON HCL 8 MG PO TABS: 8.0000 mg | ORAL_TABLET | Freq: Three times a day (TID) | ORAL | Status: DC | PRN

## 2013-02-21 MED ORDER — CYANOCOBALAMIN 1000 MCG/ML IJ SOLN
1000.0000 ug | Freq: Once | INTRAMUSCULAR | Status: AC
  Administered 2013-02-21: 1000 ug via INTRAMUSCULAR

## 2013-02-21 NOTE — Patient Instructions (Addendum)
Vitamin B12 Deficiency Not having enough vitamin B12 is called a deficiency. Vitamin B12 is an important vitamin. Your body needs vitamin B12 to:   Make red blood cells.  Make DNA. This is the genetic material inside all of your cells.  Help your nerves work properly so they can carry messages from your brain to your body. CAUSES  Not eating enough foods that contain vitamin B12.  Not having enough stomach acid and digestive juices. The body needs these to absorb vitamin B12 from the food you eat.  Having certain digestive system diseases that make it hard to absorb vitamin B12. These diseases include Crohn's disease, chronic pancreatitis, and cystic fibrosis.  Having pernicious anemia, which is a condition where the body has too few red blood cells. People with this condition do not make enough of a protein called "intrinsic factor," which is needed to absorb vitamin B12.  Having a surgery in which part of the stomach or small intestine is removed.  Taking certain medicines that make it hard for the body to absorb vitamin B12. These medicines include:  Heartburn medicine (antacids and proton pump inhibitors).  A certain antibiotic medicine called neomycin, which fights infection.  Some medicines used to treat diabetes, tuberculosis, gout, and high cholesterol. RISK FACTORS Risk factors are things that make you more likely to develop a vitamin B12 deficiency. They include:  Being older than 50.  Being a vegetarian.  Being pregnant and a vegetarian or having a poor diet.  Taking certain drugs.  Being an alcoholic. SYMPTOMS You may have a vitamin B12 deficiency with no symptoms. However, a vitamin B12 deficiency can cause health problems like anemia and nerve damage. These health problems can lead to many possible symptoms, including:  Weakness.  Fatigue.  Loss of appetite.  Weight loss.  Numbness or tingling in your hands and feet.  Redness and burning of the  tongue.  Confusion or memory problems.  Depression.  Dizziness.  Sensory problems, such as loss of taste, color blindness, and ringing in the ears.  Diarrhea or constipation.  Trouble walking. DIAGNOSIS Various types of tests can be given to help find the cause of your vitamin B12 deficiency. These tests include:  A complete blood count (CBC). This test gives your caregiver an overall picture of what makes up your blood.  A blood test to measure your B12 level.  A blood test to measure intrinsic factor.  An endoscopy. This procedure uses a thin tube with a camera on the end to look into your stomach or intestines. TREATMENT Treatment for vitamin B12 deficiency depends on what is causing it. Common options include:  Changing your eating and drinking habits, such as:  Eating more foods that contain vitamin B12.  Not drinking as much alcohol or any alcohol.  Taking vitamin B12 supplements. Your caregiver will tell you what dose is best for you.  Getting vitamin B12 injections. Some people get these a few times a week. Others get them once a month. HOME CARE INSTRUCTIONS  Take all supplements as directed by your caregiver. Follow the directions carefully.  Get any injections your caregiver prescribes. Do not miss your appointments.  Eat lots of healthy foods that contain vitamin B12. Ask your caregiver if you should work with a nutritionist. Good things to include in your diet are:  Meat.  Poultry.  Fish.  Eggs.  Fortified cereal and dairy products. This means vitamin B12 has been added to the food. Check the label on the   package to be sure.  Do not abuse alcohol.  Keep all follow-up appointments. Your caregiver will need to perform blood tests to make sure your vitamin B12 deficiency is going away. SEEK MEDICAL CARE IF:  You have any questions about your treatment.  Your symptoms come back. MAKE SURE YOU:  Understand these instructions.  Will watch your  condition.  Will get help right away if you are not doing well or get worse. Document Released: 08/28/2011 Document Reviewed: 08/28/2011 [facility] Patient Information 2014 [place], [place]. Gastritis, Adult Gastritis is soreness and swelling (inflammation) of the lining of the stomach. Gastritis can develop as a sudden onset (acute) or long-term (chronic) condition. If gastritis is not treated, it can lead to stomach bleeding and ulcers. CAUSES  Gastritis occurs when the stomach lining is weak or damaged. Digestive juices from the stomach then inflame the weakened stomach lining. The stomach lining may be weak or damaged due to viral or bacterial infections. One common bacterial infection is the Helicobacter pylori infection. Gastritis can also result from excessive alcohol consumption, taking certain medicines, or having too much acid in the stomach.  SYMPTOMS  In some cases, there are no symptoms. When symptoms are present, they may include:  Pain or a burning sensation in the upper abdomen.  Nausea.  Vomiting.  An uncomfortable feeling of fullness after eating. DIAGNOSIS  Your caregiver may suspect you have gastritis based on your symptoms and a physical exam. To determine the cause of your gastritis, your caregiver may perform the following:  Blood or stool tests to check for the H pylori bacterium.  Gastroscopy. A thin, flexible tube (endoscope) is passed down the esophagus and into the stomach. The endoscope has a light and camera on the end. Your caregiver uses the endoscope to view the inside of the stomach.  Taking a tissue sample (biopsy) from the stomach to examine under a microscope. TREATMENT  Depending on the cause of your gastritis, medicines may be prescribed. If you have a bacterial infection, such as an H pylori infection, antibiotics may be given. If your gastritis is caused by too much acid in the stomach, H2 blockers or antacids may be given. Your caregiver may  recommend that you stop taking aspirin, ibuprofen, or other nonsteroidal anti-inflammatory drugs (NSAIDs). HOME CARE INSTRUCTIONS  Only take over-the-counter or prescription medicines as directed by your caregiver.  If you were given antibiotic medicines, take them as directed. Finish them even if you start to feel better.  Drink enough fluids to keep your urine clear or pale yellow.  Avoid foods and drinks that make your symptoms worse, such as:  Caffeine or alcoholic drinks.  Chocolate.  Peppermint or mint flavorings.  Garlic and onions.  Spicy foods.  Citrus fruits, such as oranges, lemons, or limes.  Tomato-based foods such as sauce, chili, salsa, and pizza.  Fried and fatty foods.  Eat small, frequent meals instead of large meals. SEEK IMMEDIATE MEDICAL CARE IF:   You have black or dark red stools.  You vomit blood or material that looks like coffee grounds.  You are unable to keep fluids down.  Your abdominal pain gets worse.  You have a fever.  You do not feel better after 1 week.  You have any other questions or concerns. MAKE SURE YOU:  Understand these instructions.  Will watch your condition.  Will get help right away if you are not doing well or get worse. Document Released: 05/30/2001 Document Revised: 12/05/2011 Document Reviewed: 07/19/2011 [employer]  Patient Information 2014 [employer].  

## 2013-02-21 NOTE — Progress Notes (Signed)
  Subjective:    Patient ID: Tyler [name], male    DOB: 04/09/1951, 62 y.o.   MRN: [id number]  HPI    Review of Systems     Objective:   Physical Exam        Assessment & Plan:

## 2013-02-21 NOTE — Progress Notes (Signed)
  Subjective:    Patient ID: Tyler [name], male    DOB: Jun 01, 1951, 62 y.o.   MRN: [id number]  HPI 62 year old Caucasian is here today with complaints of discoloration of his Right 4th toe. Patient states he noticed the discoloration of the toe 62 years. Patient states he does not have any pain in the toe.   Patient states he still has chronic feet pain. He states the he has had swelling in his feet for about a year.  Pt states that he slipped off of a ladder on Tuesday, February 18, 2013, causing a laceration in his forehead. Pt states he did not go anywhere for evaluation of the laceration.  Pt states he is also here for Medication refill on Norco 7.5/325 mg and Phenergan 25 mg.   CO diarrhea, vomiting and chills and dehydration. Has active crohns disease. Has active Bergers disease.  Is steroid dependent and referred to endocrine and they called him 3x and he has not returned the call. Told him to go immediately next week.  Still smoking  Review of Systems     Objective:   Physical Exam  Constitutional: He appears cachectic.  Cardiovascular: Regular rhythm.  Tachycardia present.  Exam reveals no gallop.   No murmur heard. Pulmonary/Chest: He has decreased breath sounds. He has no wheezes. He has no rales.          Assessment & Plan:  DC oxycodone/restart Vicodin

## 2013-02-24 LAB — URINE CULTURE

## 2013-02-25 ENCOUNTER — Telehealth: Payer: Self-pay

## 2013-02-25 NOTE — Telephone Encounter (Signed)
Pt is needing to talk with someone about the referral we did to endocrinology when he returned the call to schedule they told him he needed to see a different provider than them  Best number [phone]

## 2013-02-26 NOTE — Telephone Encounter (Signed)
Tyler [name] will you check on this referral?

## 2013-03-07 ENCOUNTER — Ambulatory Visit (INDEPENDENT_AMBULATORY_CARE_PROVIDER_SITE_OTHER): Payer: Managed Care, Other (non HMO) | Admitting: Internal Medicine

## 2013-03-07 ENCOUNTER — Telehealth: Payer: Self-pay

## 2013-03-07 VITALS — BP 106/62 | HR 106 | Temp 98.2°F | Resp 18 | Ht 69.0 in | Wt 153.0 lb

## 2013-03-07 DIAGNOSIS — E271 Primary adrenocortical insufficiency: Secondary | ICD-10-CM

## 2013-03-07 DIAGNOSIS — G8929 Other chronic pain: Secondary | ICD-10-CM

## 2013-03-07 DIAGNOSIS — J4489 Other specified chronic obstructive pulmonary disease: Secondary | ICD-10-CM

## 2013-03-07 DIAGNOSIS — I2699 Other pulmonary embolism without acute cor pulmonale: Secondary | ICD-10-CM

## 2013-03-07 DIAGNOSIS — E2749 Other adrenocortical insufficiency: Secondary | ICD-10-CM

## 2013-03-07 DIAGNOSIS — J449 Chronic obstructive pulmonary disease, unspecified: Secondary | ICD-10-CM

## 2013-03-07 DIAGNOSIS — F17200 Nicotine dependence, unspecified, uncomplicated: Secondary | ICD-10-CM

## 2013-03-07 DIAGNOSIS — Z23 Encounter for immunization: Secondary | ICD-10-CM

## 2013-03-07 DIAGNOSIS — R635 Abnormal weight gain: Secondary | ICD-10-CM

## 2013-03-07 DIAGNOSIS — S42001A Fracture of unspecified part of right clavicle, initial encounter for closed fracture: Secondary | ICD-10-CM

## 2013-03-07 DIAGNOSIS — S42009A Fracture of unspecified part of unspecified clavicle, initial encounter for closed fracture: Secondary | ICD-10-CM

## 2013-03-07 DIAGNOSIS — E538 Deficiency of other specified B group vitamins: Secondary | ICD-10-CM

## 2013-03-07 DIAGNOSIS — M810 Age-related osteoporosis without current pathological fracture: Secondary | ICD-10-CM

## 2013-03-07 LAB — POCT CBC
HCT, POC: 40 % — AB (ref 43.5–53.7)
Hemoglobin: 12 g/dL — AB (ref 14.1–18.1)
Lymph, poc: 2.1 (ref 0.6–3.4)
MCH, POC: 30.9 pg (ref 27–31.2)
MCHC: 30 g/dL — AB (ref 31.8–35.4)
WBC: 9.5 10*3/uL (ref 4.6–10.2)

## 2013-03-07 MED ORDER — CYANOCOBALAMIN 1000 MCG/ML IJ SOLN
1000.0000 ug | Freq: Once | INTRAMUSCULAR | Status: AC
  Administered 2013-03-07: 1000 ug via INTRAMUSCULAR

## 2013-03-07 MED ORDER — OXYCODONE-ACETAMINOPHEN 10-325 MG PO TABS: 1.0000 | ORAL_TABLET | Freq: Three times a day (TID) | ORAL | Status: DC | PRN

## 2013-03-07 MED ORDER — TEMAZEPAM 30 MG PO CAPS: 30.0000 mg | ORAL_CAPSULE | Freq: Every evening | ORAL | Status: AC | PRN

## 2013-03-07 NOTE — Patient Instructions (Addendum)

## 2013-03-07 NOTE — Progress Notes (Signed)
  Subjective:    Patient ID: Tyler [name], male    DOB: 10-13-1950, 62 y.o.   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 Has gained weight, tremor resoved and is stronger. The nutrition and vitamins have helped .    Review of Systems     Objective:   Physical Exam  Constitutional: He is oriented to person, place, and time. He appears well-nourished. No distress.  HENT:  Head: Normocephalic.  Cardiovascular: Regular rhythm.  Tachycardia present.  Exam reveals no gallop.   Pulmonary/Chest: Effort normal. Not tachypneic. He has decreased breath sounds. He has no wheezes. He has rhonchi.  Musculoskeletal: Normal range of motion.  Neurological: He is alert and oriented to person, place, and time. He exhibits normal muscle tone. Coordination normal.  Skin: No rash noted.  Psychiatric: He has a normal mood and affect.   Results for orders placed in visit on 03/07/13  POCT CBC      Result Value Range   WBC 9.5  4.6 - 10.2 K/uL   Lymph, poc 2.1  0.6 - 3.4   POC LYMPH PERCENT 21.9  10 - 50 %L   MID (cbc) 0.8  0 - 0.9   POC MID % 8.7  0 - 12 %M   POC Granulocyte 6.6  2 - 6.9   Granulocyte percent 69.4  37 - 80 %G   RBC 3.88 (*) 4.69 - 6.13 M/uL   Hemoglobin 12.0 (*) 14.1 - 18.1 g/dL   HCT, POC 40.9 (*) 81.1 - 53.7 %   MCV 103.0 (*) 80 - 97 fL   MCH, POC 30.9  27 - 31.2 pg   MCHC 30.0 (*) 31.8 - 35.4 g/dL   RDW, POC 91.4     Platelet Count, POC 245  142 - 424 K/uL   MPV 7.2  0 - 99.8 fL          Assessment & Plan:  PE new Weight gain and is stronger/Trmor resolved. To see his pulmonary doctor Refer again to endocrine Refer to chronic pain center [place]

## 2013-03-07 NOTE — Telephone Encounter (Signed)
Pain management will be in [place]. Endocrine appt also to be made. Advised him we will call him with these appts.

## 2013-03-07 NOTE — Progress Notes (Signed)
  Subjective:    Patient ID: Tyler [name], male    DOB: May 29, 1951, 62 y.o.   MRN: [id number]  HPI  Pt here for a hospital follow up for pulmonary embolisim of the left lung, today sob of breath with exertion, denies fever of chills, but feels cold all the time.   Review of Systems     Objective:   Physical Exam        Assessment & Plan:

## 2013-03-07 NOTE — Telephone Encounter (Signed)
Pt was seen by Dr.Guest today.  Was suppose to receive a list of referrals of doctors he needs to see from the nurse, did not get that list.  Was calling to find out who those doctors are. Call at [phone].

## 2013-03-10 ENCOUNTER — Other Ambulatory Visit: Payer: Self-pay | Admitting: Internal Medicine

## 2013-03-14 ENCOUNTER — Ambulatory Visit (INDEPENDENT_AMBULATORY_CARE_PROVIDER_SITE_OTHER): Payer: Managed Care, Other (non HMO) | Admitting: Internal Medicine

## 2013-03-14 ENCOUNTER — Encounter: Payer: Self-pay | Admitting: Internal Medicine

## 2013-03-14 VITALS — BP 100/64 | HR 89 | Temp 98.1°F | Ht 69.0 in | Wt 158.4 lb

## 2013-03-14 DIAGNOSIS — J4489 Other specified chronic obstructive pulmonary disease: Secondary | ICD-10-CM

## 2013-03-14 DIAGNOSIS — J449 Chronic obstructive pulmonary disease, unspecified: Secondary | ICD-10-CM

## 2013-03-14 DIAGNOSIS — F17200 Nicotine dependence, unspecified, uncomplicated: Secondary | ICD-10-CM

## 2013-03-14 NOTE — Progress Notes (Signed)
  Subjective:    Patient ID: Tyler [name], male    DOB: 02-10-1951  MRN: [id number]  HPI  23 yowm active smoker referred 03/14/13 to pulmonary clinic by Dr Perrin Maltese for copd eval sp recurrent pe in early September 2014     03/14/2013 1st [facility] office visit/ Peony Barner actively still smoking  cc sob > cough s/p pe on xarelto but prior to that had developed mild doe x sev years, indolent onset, assoc with am cough/ congestion with min mucoid sputum.  Some better on spiriva with minimal perceived need for saba.  No obvious day to day or daytime variabilty or assoc chest tightness, subjective wheeze overt sinus or hb symptoms. No unusual exp hx or h/o childhood pna/ asthma or knowledge of premature birth.  Sleeping ok without nocturnal  or early am exacerbation  of respiratory  c/o's or need for noct saba. Also denies any obvious fluctuation of symptoms with weather or environmental changes or other aggravating or alleviating factors except as outlined above   Current Medications, Allergies, Complete Past Medical History, Past Surgical History, Family History, and Social History were reviewed in [employer] record.           Review of Systems  Constitutional: Negative for fever and unexpected weight change.  HENT: Negative for ear pain, nosebleeds, congestion, sore throat, rhinorrhea, sneezing, trouble swallowing, dental problem, postnasal drip and sinus pressure.   Eyes: Negative for redness and itching.  Respiratory: Positive for shortness of breath. Negative for cough, chest tightness and wheezing.   Cardiovascular: Positive for leg swelling. Negative for palpitations.  Gastrointestinal: Negative for nausea and vomiting.  Genitourinary: Negative for dysuria.  Musculoskeletal: Negative for joint swelling.  Skin: Negative for rash.  Neurological: Negative for headaches.  Hematological: Bruises/bleeds easily.  Psychiatric/Behavioral: Negative for dysphoric mood.  The patient is not nervous/anxious.        Objective:   Physical Exam  amb wm nad  Wt Readings from Last 3 Encounters:  03/14/13 158 lb 6.4 oz (71.85 kg)  03/07/13 153 lb (69.4 kg)  02/21/13 145 lb 12.8 oz (66.134 kg)      HEENT mild turbinate edema.  Oropharynx no thrush or excess pnd or cobblestoning.  No JVD or cervical adenopathy. Mild accessory muscle hypertrophy. Trachea midline, nl thryroid. Chest was hyperinflated by percussion with diminished breath sounds and moderate increased exp time without wheeze. Hoover sign positive at mid inspiration. Regular rate and rhythm without murmur gallop or rub or increase P2 or edema.  Abd: no hsm, nl excursion. Ext warm without cyanosis or clubbing.      cxr 10/20/12 Mild COPD. No active cardiopulmonary disease      Assessment & Plan:

## 2013-03-14 NOTE — Patient Instructions (Addendum)
The key is to stop smoking completely before smoking completely stops you - this is the most important aspect of your care  spiriva each am  Only use your albuterol as a rescue medication to be used if you can't catch your breath by resting or doing a relaxed purse lip breathing pattern.  - The less you use it, the better it will work when you need it. - Ok to use up to every 4 hours if you must but call for immediate appointment if use goes up over your usual need - Don't leave home without it !!  (think of it like your spare tire for your car)   Please schedule a follow up office visit in 6 weeks, call sooner if needed pfts

## 2013-03-15 NOTE — Assessment & Plan Note (Addendum)
His copd appears to be moderate in severity with the main issue at this point whether he will be willing to commit to quit now.   In meantime, spriva best bet for monotherapy but needs to take it daily / consistently  The proper method of use, as well as anticipated side effects, of a metered-dose inhaler are discussed and demonstrated to the patient. Improved effectiveness after extensive coaching during this visit to a level of approximately

## 2013-03-15 NOTE — Assessment & Plan Note (Signed)
>   3 min I reviewed the Flethcher curve with patient that basically indicates  if you quit smoking when your best day FEV1 is still well preserved (as appears to be the case here) it is highly unlikely you will progress to severe disease and informed the patient there was no medication on the market that has proven to change the curve or the likelihood of progression.  Therefore stopping smoking and maintaining abstinence is the most important aspect of care, not choice of inhalers or for that matter, doctors.

## 2013-03-18 ENCOUNTER — Ambulatory Visit: Payer: Managed Care, Other (non HMO) | Admitting: Endocrinology

## 2013-03-18 ENCOUNTER — Other Ambulatory Visit: Payer: Self-pay | Admitting: Physician Assistant

## 2013-03-26 ENCOUNTER — Ambulatory Visit (INDEPENDENT_AMBULATORY_CARE_PROVIDER_SITE_OTHER): Payer: Managed Care, Other (non HMO) | Admitting: Endocrinology

## 2013-03-26 ENCOUNTER — Encounter: Payer: Self-pay | Admitting: Endocrinology

## 2013-03-26 VITALS — BP 138/74 | HR 112 | Temp 98.2°F | Resp 14 | Ht 68.5 in | Wt 156.8 lb

## 2013-03-26 DIAGNOSIS — T380X5A Adverse effect of glucocorticoids and synthetic analogues, initial encounter: Secondary | ICD-10-CM

## 2013-03-26 DIAGNOSIS — E2749 Other adrenocortical insufficiency: Secondary | ICD-10-CM

## 2013-03-26 DIAGNOSIS — E2740 Unspecified adrenocortical insufficiency: Secondary | ICD-10-CM

## 2013-03-26 DIAGNOSIS — M2550 Pain in unspecified joint: Secondary | ICD-10-CM

## 2013-03-26 NOTE — Progress Notes (Signed)
Patient ID: Tyler [name], male   DOB: 1951-04-09, 62 y.o.   MRN: [id number]  Reason for consultation: Evaluate for adrenal insufficiency  History of Present Illness:  The patient has been taking prednisone for about 2 years, mostly because of joint pains especially in his hands. He has taken as much as 60 mg a day. More recently has been taking about 20 mg every 3-4 days as needed when he gets joint pains. He has been doing this for several weeks now He has been told to taper off his prednisone but he is unable to do so because he gets significant joint pains and stiffness without it. Only occasionally has gone up to a week or so off his prednisone. If he does this he will start having achiness all over, decreased appetite, weakness and vomiting He has not had any evaluation for his adrenal hormones at any time Electrolytes have been normal on previous evaluation He has noticed swelling of his face from taking prednisone consistently. No symptoms of weakness in his legs   Past Medical History  Diagnosis Date  . Hyperlipidemia   . Crohn disease   . Anemia   . COPD (chronic obstructive pulmonary disease)   . Hypertension   . Arthritis   . Anxiety   . Joint pain   . Pulmonary embolus 2013  . Stroke 06/14/2011  . Thromboangiitis obliterans (Buerger's disease)     Past Surgical History  Procedure Laterality Date  . Crohn's surgery  [id number]  . Appendectomy  1979  . Small intestine surgery    . Colon surgery    . Stomach surgery  03/01/12  . Colostomy closure  12/18/11    Family History  Problem Relation Age of Onset  . Colon cancer      no family Hx of  . Cancer Father     meta  . Clotting disorder Mother     blood clot  . Diabetes Sister     x2    Social History:  reports that he has been smoking Cigarettes and Cigars.  He has a 17.5 pack-year smoking history. He has never used smokeless tobacco. He reports that he does not drink alcohol or use illicit  drugs.  Allergies:  Allergies  Allergen Reactions  . Rivaroxaban     ???? Rash-see Lupton Bx      Medication List       This list is accurate as of: 03/26/13  1:09 PM.  Always use your most recent med list.               albuterol 108 (90 BASE) MCG/ACT inhaler  Commonly known as:  PROVENTIL HFA;VENTOLIN HFA  Inhale 2 puffs into the lungs as needed. For shortness of breath     albuterol (2.5 MG/3ML) 0.083% nebulizer solution  Commonly known as:  PROVENTIL  Take 3 mLs (2.5 mg total) by nebulization every 6 (six) hours as needed for wheezing.     VENTOLIN HFA 108 (90 BASE) MCG/ACT inhaler  Generic drug:  albuterol  INHALE 2 PUFFS INTO THE LUNGS EVERY 6 (SIX) HOURS AS NEEDED. FOR SHORTNESS OF BREATH     amitriptyline 150 MG tablet  Commonly known as:  ELAVIL  TAKE 1 TABLET BY MOUTH AT BEDTIME     aspirin 81 MG tablet  Take 81 mg by mouth daily.     buPROPion 150 MG 12 hr tablet  Commonly known as:  WELLBUTRIN SR  Take 150 mg by mouth 2 (two)  times daily.     ciprofloxacin 500 MG tablet  Commonly known as:  CIPRO  Take 1 tablet (500 mg total) by mouth 2 (two) times daily.     FOLIC ACID PO  Take 1 mg by mouth 2 (two) times daily.     HYDROcodone-acetaminophen 7.5-325 MG per tablet  Commonly known as:  NORCO  Take 1 tablet by mouth every 8 (eight) hours as needed for pain.     ipratropium 0.02 % nebulizer solution  Commonly known as:  ATROVENT  Take 500 mcg by nebulization as needed.     methylPREDNISolone acetate 80 MG/ML injection  Commonly known as:  DEPO-MEDROL  Inject 80 mg into the muscle once.     metoprolol succinate 50 MG 24 hr tablet  Commonly known as:  TOPROL-XL  TAKE 1 TABLET EVERY DAY     ofloxacin 0.3 % otic solution  Commonly known as:  FLOXIN  5 drops daily.     ondansetron 8 MG tablet  Commonly known as:  ZOFRAN  Take 1 tablet (8 mg total) by mouth every 8 (eight) hours as needed for nausea.     oxyCODONE-acetaminophen 10-325 MG per  tablet  Commonly known as:  PERCOCET  Take 1 tablet by mouth every 8 (eight) hours as needed for pain.     polyethylene glycol packet  Commonly known as:  MIRALAX / GLYCOLAX  Take 17 g by mouth daily.     predniSONE 10 MG tablet  Commonly known as:  DELTASONE  3/3/3/2.5/2.5/2.5/2/2/2/1.5/1.5/1.5/1/1/1/.5/.5/.5 single daily dose until done-18days     pregabalin 50 MG capsule  Commonly known as:  LYRICA  Take 50 mg by mouth 3 (three) times daily.     promethazine 25 MG tablet  Commonly known as:  PHENERGAN  Take 1 tablet (25 mg total) by mouth every 8 (eight) hours as needed for nausea.     Rivaroxaban 15 MG Tabs tablet  Commonly known as:  XARELTO  Take 15 mg by mouth 2 (two) times daily with a meal.     SPIRIVA HANDIHALER 18 MCG inhalation capsule  Generic drug:  tiotropium  PLACE 1 CAPSULE INTO INHALER AND INHALE BY MOUTH DAILY.     sulfaSALAzine 500 MG EC tablet  Commonly known as:  AZULFIDINE  Take 1 tablet (500 mg total) by mouth 2 (two) times daily.     temazepam 30 MG capsule  Commonly known as:  RESTORIL  Take 1 capsule (30 mg total) by mouth at bedtime as needed for sleep.     triamcinolone cream 0.1 %  Commonly known as:  KENALOG  Apply topically 2 (two) times daily.        No visits with results within 1 Week(s) from this visit. Latest known visit with results is:  Office Visit on 03/07/2013  Component Date Value Range Status  . WBC 03/07/2013 9.5  4.6 - 10.2 K/uL Final  . Lymph, poc 03/07/2013 2.1  0.6 - 3.4 Final  . POC LYMPH PERCENT 03/07/2013 21.9  10 - 50 %L Final  . MID (cbc) 03/07/2013 0.8  0 - 0.9 Final  . POC MID % 03/07/2013 8.7  0 - 12 %M Final  . POC Granulocyte 03/07/2013 6.6  2 - 6.9 Final  . Granulocyte percent 03/07/2013 69.4  37 - 80 %G Final  . RBC 03/07/2013 3.88* 4.69 - 6.13 M/uL Final  . Hemoglobin 03/07/2013 12.0* 14.1 - 18.1 g/dL Final  . HCT, POC 16/03/9603 40.0* 43.5 - 53.7 %  Final  . MCV 03/07/2013 103.0* 80 - 97 fL Final   . MCH, POC 03/07/2013 30.9  27 - 31.2 pg Final  . MCHC 03/07/2013 30.0* 31.8 - 35.4 g/dL Final  . RDW, POC 16/03/9603 17.5   Final  . Platelet Count, POC 03/07/2013 245  142 - 424 K/uL Final  . MPV 03/07/2013 7.2  0 - 99.8 fL Final     Chemistry      Component Value Date/Time   NA 138 02/21/2013 1054   K 3.4* 02/21/2013 1054   CL 97 02/21/2013 1054   CO2 29 02/21/2013 1054   BUN 7 02/21/2013 1054   CREATININE 0.78 02/21/2013 1054   CREATININE 0.77 08/08/2012 1036      Component Value Date/Time   CALCIUM 8.6 02/21/2013 1054   ALKPHOS 77 01/30/2013 1441   AST 27 01/30/2013 1441   ALT 19 01/30/2013 1441   BILITOT 0.6 01/30/2013 1441       REVIEW OF SYSTEMS:          Has a history of vitamin B12 deficiency and has been treated with injections and oral supplements for several months      Skin: No rash or infections     Thyroid:  No cold or heat intolerance, unusual fatigue.     His blood pressure has been high, treated with metoprolol. No palpitations except on exertion    He has previous history of episodic sensation of faintness on standing up, not recently     No swelling of feet.      He will get shortness of breath on exertion, but also have accompanying rapid heartbeat. Has COPD      Bowel habits: Normal currently. Previously has had Crohn's disease and has had multiple surgeries for this     His weight has been relatively stable recently and has normal appetite  He has had joint pains for the last few years, apparently told that he does not have a rheumatological condition by rheumatologist. Will take hydrocodone and is supposed to be going to a pain clinic    Has history of Raynauds in his hands but mostly in the winter    He gets stinging pains in his feet for about a year or more   PHYSICAL EXAM:  BP 158/94  Pulse 112  Temp(Src) 98.2 F (36.8 C)  Resp 14  Ht 5' 8.5" (1.74 m)  Wt 156 lb 12.8 oz (71.124 kg)  BMI 23.49 kg/m2  SpO2 97%  GENERAL: He has a rounded  facies with mild swelling                     No significant buffalo hump or supraclavicular fat pads  No pallor, clubbing, lymphadenopathy or edema.  Skin:  no rash or pigmentation.  EYES:  Externally normal.   ENT: Oral mucosa and tongue normal.  THYROID:  Not palpable.  HEART:  Normal S1 and S2; no murmur or click.  CHEST:  Normal shape Lungs:  Vescicular breath sounds heard, relatively distant.  No crepitations/ wheeze.  ABDOMEN:  No distention.  Liver and spleen not palpable.  No other mass or tenderness. Has large ecchymosis on the left lateral abdominal area  NEUROLOGICAL: Motor power normal. Monofilament sensation in feet is                          Vibration sense at toes is                                .  Reflexes are absent bilaterally at ankles.  SPINE AND JOINTS:  Normal.   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 Discussed with the patient that it will be difficult to wean him off prednisone because of his getting significant arthralgia when he takes lower doses. He will need to have an alternative treatment for his arthralgia; he is apparently going to be seen at pain clinic. Has seen a rheumatologist previously   Arthralgias of unknown etiology and also history Reynaud's phenomenon  PLAN:   Will try to do a more gradual steroid taper with scheduled doses of prednisone and try to do this over the next 12 weeks or so Since he is taking about 6 mg daily on average Will have him take 15 mg every 3 days on a regular basis for 3 weeks and then reduce the dose by 2.5 mg every 3 weeks. In 6 weeks will do an ACTH stimulation test  to test his adrenal reserve and decide on further tapering accordingly  However he will need to discuss with PCP about treatment of his arthralgia since he will not be able to get off prednisone without an alternative treatment. He can discuss this with  pain management or go back to his rheumatologist for further discussion  [facility] 03/26/2013, 1:09 PM

## 2013-03-26 NOTE — Patient Instructions (Signed)
PREDNISONE 5MG , 3 PILLS EVERY 3 DAYS FOR 3 WEEKS  THEN 12.5 MG (2 1/2) EVERY 3 DAYS FOR NEXT 3 WEEKS  Test to be done the due day of prednisone without taking the pills

## 2013-04-06 ENCOUNTER — Ambulatory Visit: Payer: Managed Care, Other (non HMO) | Admitting: Internal Medicine

## 2013-04-06 VITALS — BP 130/82 | HR 74 | Temp 98.2°F | Resp 16 | Ht 68.0 in | Wt 155.0 lb

## 2013-04-06 DIAGNOSIS — I2699 Other pulmonary embolism without acute cor pulmonale: Secondary | ICD-10-CM

## 2013-04-06 DIAGNOSIS — J449 Chronic obstructive pulmonary disease, unspecified: Secondary | ICD-10-CM

## 2013-04-06 DIAGNOSIS — E538 Deficiency of other specified B group vitamins: Secondary | ICD-10-CM

## 2013-04-06 DIAGNOSIS — K50919 Crohn's disease, unspecified, with unspecified complications: Secondary | ICD-10-CM

## 2013-04-06 DIAGNOSIS — N390 Urinary tract infection, site not specified: Secondary | ICD-10-CM

## 2013-04-06 DIAGNOSIS — K5090 Crohn's disease, unspecified, without complications: Secondary | ICD-10-CM

## 2013-04-06 DIAGNOSIS — G8929 Other chronic pain: Secondary | ICD-10-CM

## 2013-04-06 LAB — D-DIMER, QUANTITATIVE: D-Dimer, Quant: 1.63 ug/mL-FEU — ABNORMAL HIGH (ref 0.00–0.48)

## 2013-04-06 LAB — POCT UA - MICROSCOPIC ONLY
Crystals, Ur, HPF, POC: NEGATIVE
WBC, Ur, HPF, POC: NEGATIVE
Yeast, UA: NEGATIVE

## 2013-04-06 LAB — POCT URINALYSIS DIPSTICK
Blood, UA: NEGATIVE
Ketones, UA: NEGATIVE
Leukocytes, UA: NEGATIVE
Protein, UA: NEGATIVE
Spec Grav, UA: 1.02
Urobilinogen, UA: 0.2

## 2013-04-06 LAB — POCT CBC
Lymph, poc: 0.9 (ref 0.6–3.4)
MCH, POC: 29.7 pg (ref 27–31.2)
MCHC: 29.5 g/dL — AB (ref 31.8–35.4)
MID (cbc): 0.3 (ref 0–0.9)
MPV: 6.5 fL (ref 0–99.8)
POC Granulocyte: 6.4 (ref 2–6.9)
POC MID %: 4 %M (ref 0–12)
Platelet Count, POC: 251 10*3/uL (ref 142–424)
WBC: 7.6 10*3/uL (ref 4.6–10.2)

## 2013-04-06 MED ORDER — TRAMADOL HCL 50 MG PO TABS: 50.0000 mg | ORAL_TABLET | Freq: Four times a day (QID) | ORAL | Status: DC | PRN

## 2013-04-06 MED ORDER — CYANOCOBALAMIN 1000 MCG/ML IJ SOLN
1000.0000 ug | Freq: Once | INTRAMUSCULAR | Status: AC
  Administered 2013-04-06: 1000 ug via INTRAMUSCULAR

## 2013-04-06 NOTE — Progress Notes (Signed)
Subjective:    Patient ID: Tyler [name], male    DOB: 06-28-1950, 62 y.o.   MRN: [id number]  HPI Pt here for a medication refill on xarelto, and norco for pain . Seeing Dr. 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 Sherene Sires for COPD,  Dr. Christella Hartigan for Crohns disease and recently had an obstruction.  Has a confusing hx of an ER visit to [place] where Lesia Hausen was restarted? He will bring CT and tests results to me next week to clarify this therapy. He is B12 deficient, due to malabsorption. He has chronic pain from Buergers disease and arthritis, found that tramadol 50mg  QID handles the pain.  Ran out xaralto 3 days ago   Review of Systems     Objective:   Physical Exam  Vitals reviewed. Constitutional: He is oriented to person, place, and time. He appears well-developed and well-nourished. No distress.  HENT:  Right Ear: External ear normal.  Left Ear: External ear normal.  Nose: Nose normal.  Steroid faces  Eyes: EOM are normal. No scleral icterus.  Cardiovascular: Regular rhythm.  Tachycardia present.  Exam reveals no gallop.   No murmur heard. Pulmonary/Chest: He has decreased breath sounds. He has no wheezes. He has rhonchi. He has no rales.  Abdominal: Soft. There is no tenderness.  Musculoskeletal: He exhibits tenderness.  Neurological: He is alert and oriented to person, place, and time. No cranial nerve deficit. He exhibits normal muscle tone. Coordination normal.  Psychiatric: He has a normal mood and affect. His behavior is normal. Thought content normal.   Oximetry 99% pulse 113 Results for orders placed in visit on 04/06/13  POCT CBC      Result Value Range   WBC 7.6  4.6 - 10.2 K/uL   Lymph, poc 0.9  0.6 - 3.4   POC LYMPH PERCENT 11.4  10 - 50 %L   MID (cbc) 0.3  0 - 0.9   POC MID % 4.0  0 - 12 %M   POC Granulocyte 6.4  2 - 6.9   Granulocyte percent 84.6 (*) 37 - 80 %G   RBC 3.54 (*) 4.69 - 6.13 M/uL   Hemoglobin 10.5 (*) 14.1 - 18.1 g/dL   HCT, POC 40.9 (*) 81.1  - 53.7 %   MCV 100.5 (*) 80 - 97 fL   MCH, POC 29.7  27 - 31.2 pg   MCHC 29.5 (*) 31.8 - 35.4 g/dL   RDW, POC 91.4     Platelet Count, POC 251  142 - 424 K/uL   MPV 6.5  0 - 99.8 fL   Results for orders placed in visit on 04/06/13  POCT URINALYSIS DIPSTICK      Result Value Range   Color, UA yellow     Clarity, UA clear     Glucose, UA neg     Bilirubin, UA neg     Ketones, UA neg     Spec Grav, UA 1.020     Blood, UA neg     pH, UA 5.5     Protein, UA neg     Urobilinogen, UA 0.2     Nitrite, UA neg     Leukocytes, UA Negative    POCT UA - MICROSCOPIC ONLY      Result Value Range   WBC, Ur, HPF, POC neg     RBC, urine, microscopic 0-1     Bacteria, U Microscopic neg     Mucus, UA trace     Epithelial cells,  urine per micros 0-4     Crystals, Ur, HPF, POC neg     Casts, Ur, LPF, POC neg     Yeast, UA neg    POCT CBC      Result Value Range   WBC 7.6  4.6 - 10.2 K/uL   Lymph, poc 0.9  0.6 - 3.4   POC LYMPH PERCENT 11.4  10 - 50 %L   MID (cbc) 0.3  0 - 0.9   POC MID % 4.0  0 - 12 %M   POC Granulocyte 6.4  2 - 6.9   Granulocyte percent 84.6 (*) 37 - 80 %G   RBC 3.54 (*) 4.69 - 6.13 M/uL   Hemoglobin 10.5 (*) 14.1 - 18.1 g/dL   HCT, POC 40.9 (*) 81.1 - 53.7 %   MCV 100.5 (*) 80 - 97 fL   MCH, POC 29.7  27 - 31.2 pg   MCHC 29.5 (*) 31.8 - 35.4 g/dL   RDW, POC 91.4     Platelet Count, POC 251  142 - 424 K/uL   MPV 6.5  0 - 99.8 fL    Drop in H/H  Spent over 1 hour with patient, with difficulty obtained some records from [employer] [facility]    Assessment & Plan:  RTC and see me next week with [place] records. Confirm need for xaralto or see doc in [place] to confirm. RF tramadol B12 IM/UTI resolved  10/21 refilled xarelto 20mg  qd, ct in [place] confirmed PE.

## 2013-04-06 NOTE — Patient Instructions (Signed)
Pulmonary Embolus A pulmonary (lung) embolus (PE) is a blood clot that has traveled from another place in the body to the lung. Most clots come from deep veins in the legs or pelvis. PE is a dangerous and potentially life-threatening condition that can be treated if identified. CAUSES Blood clots form in a vein for different reasons. Usually several things cause blood clots. They include:  The flow of blood slows down.  The inside of the vein is damaged in some way.  The person has a condition that makes the blood clot more easily. These conditions may include:  Older age (especially over 75 years old).  Having a history of blood clots.  Having major or lengthy surgery. Hip surgery is particularly high-risk.  Breaking a hip or leg.  Sitting or lying still for a long time.  Cancer or cancer treatment.  Having a long, thin tube (catheter) placed inside a vein during a medical procedure.  Being overweight (obese).  Pregnancy and childbirth.  Medicines with estrogen.  Smoking.  Other circulation or heart problems. SYMPTOMS  The symptoms of a PE usually start suddenly and include:  Shortness of breath.  Coughing.  Coughing up blood or blood-tinged mucus (phlegm).  Chest pain. Pain is often worse with deep breaths.  Rapid heartbeat. DIAGNOSIS  If a PE is suspected, your caregiver will take a medical history and carry out a physical exam. Your caregiver will check for the risk factors listed above. Tests that also may be required include:  Blood tests, including studies of the clotting properties of your blood.  Imaging tests. Ultrasound, CT, MRI, and other tests can all be used to see if you have clots in your legs or lungs. If you have a clot in your legs and have breathing or chest problems, your caregiver may conclude that you have a clot in your lungs. Further lung tests may not be needed.  Electrocardiography can look for heart strain from blood clots in the  lungs. PREVENTION   Exercise the legs regularly. Take a brisk 30 minute walk every day.  Maintain a weight that is appropriate for your height.  Avoid sitting or lying in bed for long periods of time without moving your legs.  Women, particularly those over the age of 35, should consider the risks and benefits of taking estrogen medicines, including birth control pills.  Do not smoke, especially if you take estrogen medicines.  Long-distance travel can increase your risk. You should exercise your legs by walking or pumping the muscles every hour.  In hospital prevention:  Your caregiver will assess your need for preventive PE care (prophylaxis) when you are admitted to the hospital. If you are having surgery, your surgeon will assess you the day of or day after surgery.  Prevention may include medical and nonmedical measures. TREATMENT   The most common treatment for a PE is blood thinning (anticoagulant) medicine, which reduces the blood's tendency to clot. Anticoagulants can stop new blood clots from forming and old ones from growing. They cannot dissolve existing clots. Your body does this by itself over time. Anticoagulants can be given by mouth, by intravenous (IV) access, or by injection. Your caregiver will determine the best program for you.  Less commonly, clot-dissolving drugs (thrombolytics) are used to dissolve a PE. They carry a high risk of bleeding, so they are used mainly in severe cases.  Very rarely, a blood clot in the leg needs to be removed surgically.  If you are unable to   take anticoagulants, your caregiver may arrange for you to have a filter placed in a main vein in your abdomen. This filter prevents clots from traveling to your lungs. HOME CARE INSTRUCTIONS   Take all medicines prescribed by your caregiver. Follow the directions carefully.  Warfarin. Most people will continue taking warfarin after hospital discharge. Your caregiver will advise you on the  length of treatment (usually 3 6 months, sometimes lifelong).  Too much and too little warfarin are both dangerous. Too much warfarin increases the risk of bleeding. Too little warfarin continues to allow the risk for blood clots. While taking warfarin, you will need to have regular blood tests to measure your blood clotting time. These blood tests usually include both the prothrombin time (PT) and International Normalized Ratio (INR) tests. The PT and INR results allow your caregiver to adjust your dose of warfarin. The dose can change for many reasons. It is critically important that you take warfarin exactly as prescribed, and that you have your PT and INR levels drawn exactly as directed.  Many foods, especially foods high in vitamin K can interfere with warfarin and affect the PT and INR results. Foods high in vitamin K include spinach, kale, broccoli, cabbage, collard and turnip greens, brussels sprouts, peas, cauliflower, seaweed, and parsley as well as beef and pork liver, green tea, and soybean oil. You should eat a consistent amount of foods high in vitamin K. Avoid major changes in your diet, or notify your caregiver before changing your diet. Arrange a visit with a dietitian to answer your questions.  Many medicines can interfere with warfarin and affect the PT and INR results. You must tell your caregiver about any and all medicines you take, this includes all vitamins and supplements. Be especially cautious with aspirin and anti-inflammatory medicines. Ask your caregiver before taking these. Do not take or discontinue any prescribed or over-the-counter medicine except on the advice of your caregiver or pharmacist.  Warfarin can have side effects, such as excessive bruising or bleeding. You will need to hold pressure over cuts for longer than usual.  Alcohol can change the body's ability to handle warfarin. It is best to avoid alcoholic drinks or consume only very small amounts while taking  warfarin. Notify your caregiver if you change your alcohol intake.  Notify your dentist or other caregivers before procedures.  Avoid contact sports.  Wear a medical alert bracelet or carry a medical alert card.  Ask your caregiver how soon you can go back to normal activities. Not being active can lead to new clots. Ask for a list of what you should and should not do.  Compression stockings. These are tight elastic stockings that apply pressure to the lower legs. This can help keep the blood in the legs from clotting. You may need to wear compressions stockings at home to help prevent clots.  Smoking. If you smoke, quit. Ask your caregiver for help with quitting smoking.  Learn as much as you can about PE. Educating yourself can help prevent PE from reoccurring. SEEK MEDICAL CARE IF:   You notice a rapid heartbeat.  You feel weaker or more tired than usual.  You feel faint.  You notice increased bruising.  Your symptoms are not getting better in the time expected.  You are having side effects of medicine. SEEK IMMEDIATE MEDICAL CARE IF:   You have chest pain.  You have trouble breathing.  You have new or increased swelling or pain in one leg.  You   cough up blood.  You notice blood in vomit, in a bowel movement, or in urine.  You have an oral temperature above 102 F (38.9 C), not controlled by medicine. You may have another PE. A blood clot in the lungs is a medical emergency. Call your local emergency services (911 in [place] to get to the nearest hospital or clinic. Do not drive yourself. MAKE SURE YOU:   Understand these instructions.  Will watch your condition.  Will get help right away if you are not doing well or get worse. Document Released: 06/02/2000 Document Revised: 12/05/2011 Document Reviewed: 12/07/2008 [employer] Patient Information 2014 [employer].  

## 2013-04-07 ENCOUNTER — Other Ambulatory Visit: Payer: Self-pay | Admitting: Internal Medicine

## 2013-04-08 MED ORDER — RIVAROXABAN 20 MG PO TABS: 20.0000 mg | ORAL_TABLET | Freq: Every day | ORAL | Status: DC

## 2013-04-08 NOTE — Telephone Encounter (Signed)
No answer on both phones Dr Perrin Maltese wanted to speak with patient about CT Angio Chest that was done 03/02/13 at  [facility]

## 2013-04-20 ENCOUNTER — Other Ambulatory Visit: Payer: Self-pay | Admitting: Internal Medicine

## 2013-04-25 ENCOUNTER — Ambulatory Visit: Payer: Managed Care, Other (non HMO) | Admitting: Internal Medicine

## 2013-04-26 ENCOUNTER — Ambulatory Visit (INDEPENDENT_AMBULATORY_CARE_PROVIDER_SITE_OTHER): Payer: Managed Care, Other (non HMO) | Admitting: Internal Medicine

## 2013-04-26 VITALS — BP 102/70 | HR 106 | Temp 97.3°F | Resp 18 | Ht 68.0 in | Wt 149.0 lb

## 2013-04-26 DIAGNOSIS — R630 Anorexia: Secondary | ICD-10-CM

## 2013-04-26 DIAGNOSIS — F1920 Other psychoactive substance dependence, uncomplicated: Secondary | ICD-10-CM

## 2013-04-26 DIAGNOSIS — J449 Chronic obstructive pulmonary disease, unspecified: Secondary | ICD-10-CM

## 2013-04-26 DIAGNOSIS — R1110 Vomiting, unspecified: Secondary | ICD-10-CM

## 2013-04-26 DIAGNOSIS — G4700 Insomnia, unspecified: Secondary | ICD-10-CM

## 2013-04-26 DIAGNOSIS — E860 Dehydration: Secondary | ICD-10-CM

## 2013-04-26 DIAGNOSIS — N390 Urinary tract infection, site not specified: Secondary | ICD-10-CM

## 2013-04-26 DIAGNOSIS — IMO0002 Reserved for concepts with insufficient information to code with codable children: Secondary | ICD-10-CM

## 2013-04-26 DIAGNOSIS — D649 Anemia, unspecified: Secondary | ICD-10-CM

## 2013-04-26 DIAGNOSIS — M1990 Unspecified osteoarthritis, unspecified site: Secondary | ICD-10-CM

## 2013-04-26 DIAGNOSIS — I959 Hypotension, unspecified: Secondary | ICD-10-CM

## 2013-04-26 DIAGNOSIS — R319 Hematuria, unspecified: Secondary | ICD-10-CM

## 2013-04-26 DIAGNOSIS — R634 Abnormal weight loss: Secondary | ICD-10-CM

## 2013-04-26 DIAGNOSIS — K639 Disease of intestine, unspecified: Secondary | ICD-10-CM

## 2013-04-26 DIAGNOSIS — R000 Tachycardia, unspecified: Secondary | ICD-10-CM

## 2013-04-26 DIAGNOSIS — R059 Cough, unspecified: Secondary | ICD-10-CM

## 2013-04-26 DIAGNOSIS — I998 Other disorder of circulatory system: Secondary | ICD-10-CM

## 2013-04-26 DIAGNOSIS — Z7189 Other specified counseling: Secondary | ICD-10-CM

## 2013-04-26 DIAGNOSIS — S0180XA Unspecified open wound of other part of head, initial encounter: Secondary | ICD-10-CM

## 2013-04-26 DIAGNOSIS — IMO0001 Reserved for inherently not codable concepts without codable children: Secondary | ICD-10-CM

## 2013-04-26 DIAGNOSIS — I10 Essential (primary) hypertension: Secondary | ICD-10-CM

## 2013-04-26 DIAGNOSIS — K5900 Constipation, unspecified: Secondary | ICD-10-CM

## 2013-04-26 DIAGNOSIS — R197 Diarrhea, unspecified: Secondary | ICD-10-CM

## 2013-04-26 DIAGNOSIS — E538 Deficiency of other specified B group vitamins: Secondary | ICD-10-CM

## 2013-04-26 DIAGNOSIS — K5090 Crohn's disease, unspecified, without complications: Secondary | ICD-10-CM

## 2013-04-26 DIAGNOSIS — R05 Cough: Secondary | ICD-10-CM

## 2013-04-26 LAB — COMPREHENSIVE METABOLIC PANEL
ALT: 18 U/L (ref 0–53)
AST: 27 U/L (ref 0–37)
Albumin: 3 g/dL — ABNORMAL LOW (ref 3.5–5.2)
Alkaline Phosphatase: 93 U/L (ref 39–117)
BUN: 6 mg/dL (ref 6–23)
Chloride: 106 mEq/L (ref 96–112)
Potassium: 4.4 mEq/L (ref 3.5–5.3)
Sodium: 138 mEq/L (ref 135–145)
Total Bilirubin: 0.5 mg/dL (ref 0.3–1.2)

## 2013-04-26 LAB — POCT CBC
Granulocyte percent: 63.5 %G (ref 37–80)
HCT, POC: 38.7 % — AB (ref 43.5–53.7)
Hemoglobin: 11.3 g/dL — AB (ref 14.1–18.1)
Lymph, poc: 2.2 (ref 0.6–3.4)
MCHC: 29.2 g/dL — AB (ref 31.8–35.4)
MCV: 93.5 fL (ref 80–97)
MPV: 7.5 fL (ref 0–99.8)
Platelet Count, POC: 333 10*3/uL (ref 142–424)
RDW, POC: 18.4 %
WBC: 8.2 10*3/uL (ref 4.6–10.2)

## 2013-04-26 MED ORDER — HYDROCODONE-ACETAMINOPHEN 7.5-325 MG PO TABS: 1.0000 | ORAL_TABLET | Freq: Three times a day (TID) | ORAL | Status: DC | PRN

## 2013-04-26 NOTE — Progress Notes (Signed)
This chart was scribed for [employer]. Tyler Riches, MD by Tyler [name], Medical Scribe. This patient was seen in Room/bed 11 and the patient's care was started at 2:12 PM.  Subjective:    Patient ID: Tyler [name], male    DOB: 02/28/1951, 62 y.o.   MRN: [id number]  HPI HPI Comments: Tyler [name] is a 62 y.o. male who presents to [facility] requesting prescription refill. Pt states that he isn't feeling any better. He reports associated weakness and fatigue ? due to pain. Pt also reports nausea and had an episode of emesis last night. He states that the nausea is intermittent but sometimes goes days without feeling nauseous. His Prednisone is being weaned  by Tyler [name], altho he just stopped it on his own 10 days ago. Needs cortisol testing after stimulation when off. Pt had been taking 1 20 mg tablet every 5 days before discontinuing use completely. He is requesting a steroid shot today. Pt is able to use Tramadol for discomfort but he states he has not had any relief.  Pt received a letter from a pain clinic yesterday. His provider wants him to see the pain clinic for future pain management.  Pt had another pulmonary embolism in September and was hospitalized at [facility] in [place] for this. Because of the incident he was re-prescribed Zarelto.Repeat CT Angio 10/12 showed partial resolution. He was seen at [facility] one and a half weeks ago for emesis and the providers thought that he may have had a partial blockage.CT was suggestive but surgery consult disagreed. Intermittent vomiting since, but seldom pain.   Pt states he wishes he didn't have to take the medication. Pt is requesting steroid shot to help him "get himself together".   Review of Systems  Constitutional: Positive for fatigue.  Gastrointestinal: Positive for nausea and vomiting.  Musculoskeletal: Positive for arthralgias and back pain.  Neurological: Positive for weakness.  no fever  Past Surgical History   Procedure Laterality Date  . Crohn's surgery  [id number]  . Appendectomy  1979  . Small intestine surgery    . Colon surgery    . Stomach surgery  03/01/12  . Colostomy closure  12/18/11    Past Medical History  Diagnosis Date  . Hyperlipidemia   . Crohn disease   . Anemia   . COPD (chronic obstructive pulmonary disease)   . Hypertension   . Arthritis   . Anxiety   . Joint pain   . Pulmonary embolus 2013  . Stroke 06/14/2011  . Thromboangiitis obliterans (Buerger's disease)     Allergies  Allergen Reactions  . Rivaroxaban     Not allergic        Objective:   Physical Exam  Nursing note and vitals reviewed. Constitutional: He is oriented to person, place, and time. He appears well-developed and well-nourished. No distress.  Appears jittery  HENT:  Head: Normocephalic.  Eyes: EOM are normal. Pupils are equal, round, and reactive to light.  Neck: Normal range of motion.  Cardiovascular:  Tachy as usual  Pulmonary/Chest: Effort normal. No respiratory distress.  Musculoskeletal:  No active hand ulceration  Neurological: He is alert and oriented to person, place, and time.  Psychiatric: He has a normal mood and affect. His behavior is normal.  BP 102/70  Pulse 106  Temp(Src) 97.3 F (36.3 C) (Oral)  Resp 18  Ht 5\' 8"  (1.727 m)  Wt 149 lb (67.586 kg)  BMI 22.66 kg/m2  SpO2 100%  Assessment & Plan:  Anemia - Plan: POCT CBC  Steroid dependent but off 10days - Plan: Comprehensive metabolic panel, Cortisol  Arthritis - Plan: HYDROcodone-acetaminophen (NORCO) 7.5-325 MG per tablet refilled  Will notify results and set up plan with Tyler [name]

## 2013-05-01 ENCOUNTER — Telehealth: Payer: Self-pay | Admitting: *Deleted

## 2013-05-01 NOTE — Telephone Encounter (Signed)
Message copied by Hermenia Bers on Thu May 01, 2013  2:30 PM ------      Message from: Reather Littler      Created: Thu May 01, 2013  8:17 AM       He was supposed to be seen at the end of November for the Cortrosyn stimulation test and office visit ------

## 2013-05-01 NOTE — Telephone Encounter (Signed)
No answer on either phone 

## 2013-05-07 ENCOUNTER — Telehealth: Payer: Self-pay | Admitting: *Deleted

## 2013-05-07 NOTE — Telephone Encounter (Signed)
Message copied by Hermenia Bers on Wed May 07, 2013  9:37 AM ------      Message from: Tyler [name]      Created: Thu May 01, 2013  8:17 AM       He was supposed to be seen at the end of November for the Cortrosyn stimulation test and office visit ------

## 2013-05-07 NOTE — Telephone Encounter (Signed)
Left message to return call 

## 2013-05-16 ENCOUNTER — Ambulatory Visit: Payer: Managed Care, Other (non HMO) | Admitting: Internal Medicine

## 2013-05-16 VITALS — BP 108/64 | HR 99 | Temp 98.0°F | Resp 17 | Ht 69.0 in | Wt 157.0 lb

## 2013-05-16 DIAGNOSIS — G4700 Insomnia, unspecified: Secondary | ICD-10-CM

## 2013-05-16 DIAGNOSIS — M79601 Pain in right arm: Secondary | ICD-10-CM

## 2013-05-16 DIAGNOSIS — E538 Deficiency of other specified B group vitamins: Secondary | ICD-10-CM

## 2013-05-16 DIAGNOSIS — R1110 Vomiting, unspecified: Secondary | ICD-10-CM

## 2013-05-16 DIAGNOSIS — M1990 Unspecified osteoarthritis, unspecified site: Secondary | ICD-10-CM

## 2013-05-16 DIAGNOSIS — R059 Cough, unspecified: Secondary | ICD-10-CM

## 2013-05-16 DIAGNOSIS — R05 Cough: Secondary | ICD-10-CM

## 2013-05-16 DIAGNOSIS — M79609 Pain in unspecified limb: Secondary | ICD-10-CM

## 2013-05-16 DIAGNOSIS — R630 Anorexia: Secondary | ICD-10-CM

## 2013-05-16 DIAGNOSIS — N390 Urinary tract infection, site not specified: Secondary | ICD-10-CM

## 2013-05-16 DIAGNOSIS — R319 Hematuria, unspecified: Secondary | ICD-10-CM

## 2013-05-16 DIAGNOSIS — E860 Dehydration: Secondary | ICD-10-CM

## 2013-05-16 DIAGNOSIS — I959 Hypotension, unspecified: Secondary | ICD-10-CM

## 2013-05-16 DIAGNOSIS — I731 Thromboangiitis obliterans [Buerger's disease]: Secondary | ICD-10-CM

## 2013-05-16 DIAGNOSIS — R269 Unspecified abnormalities of gait and mobility: Secondary | ICD-10-CM

## 2013-05-16 DIAGNOSIS — R251 Tremor, unspecified: Secondary | ICD-10-CM

## 2013-05-16 DIAGNOSIS — R200 Anesthesia of skin: Secondary | ICD-10-CM

## 2013-05-16 DIAGNOSIS — S0180XA Unspecified open wound of other part of head, initial encounter: Secondary | ICD-10-CM

## 2013-05-16 DIAGNOSIS — R634 Abnormal weight loss: Secondary | ICD-10-CM

## 2013-05-16 DIAGNOSIS — Z7189 Other specified counseling: Secondary | ICD-10-CM

## 2013-05-16 DIAGNOSIS — I998 Other disorder of circulatory system: Secondary | ICD-10-CM

## 2013-05-16 DIAGNOSIS — K5090 Crohn's disease, unspecified, without complications: Secondary | ICD-10-CM

## 2013-05-16 DIAGNOSIS — R000 Tachycardia, unspecified: Secondary | ICD-10-CM

## 2013-05-16 DIAGNOSIS — IMO0001 Reserved for inherently not codable concepts without codable children: Secondary | ICD-10-CM

## 2013-05-16 DIAGNOSIS — I639 Cerebral infarction, unspecified: Secondary | ICD-10-CM

## 2013-05-16 DIAGNOSIS — K639 Disease of intestine, unspecified: Secondary | ICD-10-CM

## 2013-05-16 DIAGNOSIS — I10 Essential (primary) hypertension: Secondary | ICD-10-CM

## 2013-05-16 DIAGNOSIS — R259 Unspecified abnormal involuntary movements: Secondary | ICD-10-CM

## 2013-05-16 DIAGNOSIS — J449 Chronic obstructive pulmonary disease, unspecified: Secondary | ICD-10-CM

## 2013-05-16 DIAGNOSIS — K5900 Constipation, unspecified: Secondary | ICD-10-CM

## 2013-05-16 MED ORDER — CYANOCOBALAMIN 1000 MCG/ML IJ SOLN
1000.0000 ug | Freq: Once | INTRAMUSCULAR | Status: AC
  Administered 2013-05-16: 1000 ug via INTRAMUSCULAR

## 2013-05-16 MED ORDER — HYDROCODONE-ACETAMINOPHEN 7.5-325 MG PO TABS: 1.0000 | ORAL_TABLET | Freq: Three times a day (TID) | ORAL | Status: DC | PRN

## 2013-05-16 NOTE — Progress Notes (Signed)
   Subjective:    Patient ID: Tyler [name], male    DOB: [birth year], 62 y.o.   MRN: [id number]  HPI 62 year old male patient presents with right arm and hand pain. He has continued problems with his arm and hand for the past year. The pain really increased about a week ago. It goes up to his elbow. He says his legs will start to feel weak and sometimes he will fall because of it. The weakness is in both legs, but more in the right. His wife says that his speech has changed; she sometimes has a harder time understanding him.  Hx of TIA in the past, Crhons disease active, B12 deficiency, Buergers disease and still smokes, presently under evaluation for Addisons disease with Dr. Lucianne Muss, recent pulmonary emboli on anticoagulant. 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  Also has raynauds Review of Systems See problem list.    Objective:   Physical Exam  Constitutional: He is oriented to person, place, and time. No distress.  Eyes: EOM are normal. No scleral icterus.  Neck: Normal range of motion. No thyromegaly present.  Cardiovascular: Regular rhythm, S1 normal and S2 normal.  Tachycardia present.  Exam reveals decreased pulses. Exam reveals no gallop.   No murmur heard. Pulses:      Radial pulses are 1+ on the right side, and 1+ on the left side.       Dorsalis pedis pulses are 1+ on the right side, and 1+ on the left side.  Pulmonary/Chest: Effort normal.  Abdominal: Soft. There is no tenderness.  Musculoskeletal: He exhibits edema.  Neurological: He is alert and oriented to person, place, and time. He displays tremor. No cranial nerve deficit or sensory deficit. He displays a negative Romberg sign. Gait abnormal. He displays no Babinski's sign on the right side. He displays no Babinski's sign on the left side.  Reflex Scores:      Tricep reflexes are 2+ on the right side and 2+ on the left side.      Bicep reflexes are 2+ on the right side and 2+ on the left side.      Patellar  reflexes are 3+ on the right side and 3+ on the left side.      Achilles reflexes are 3+ on the right side and 3+ on the left side. Psychiatric: He has a normal mood and affect.          Assessment & Plan:  Refer to neurology/Refer MRI brain B12 1cc IM/Nutrition supplement

## 2013-05-16 NOTE — Patient Instructions (Addendum)
Addison's Disease Addison's disease is a glandular (endocrine) or hormonal disorder. It is also called adrenal insufficiency or hypocortisolism. It affects about 1 in 100,000 people. It can affect men and women in all age groups. This disease occurs when the adrenal glands do not make enough of the hormone cortisol. In some cases, the adrenal glands also do not make the hormone aldosterone. Without the right levels of these hormones, your body cannot maintain critical life functions. The adrenal glands are located just above the kidneys. Cortisol is a steroid hormone. Its most important job is to help the body respond to stress. Cortisol also helps the body to:  Maintain blood pressure and heart(cardiovascular) function.  Slow the immune system's response to inflammation.  Control the use of proteins, carbohydrates (sugars), and fats.  Maintain a sense of well-being. CAUSES  A lack of cortisol can happen for different reasons.   Primary adrenal insufficiency is Addison's disease. The adrenal glands do not produce enough, or any, cortisol. Usually, aldosterone is also not produced.  Secondary adrenal insufficiency. The pituitary gland may not make enough of a hormone called ACTH (adrenocorticotropin). This hormone causes the adrenal glands to produce cortisol. Usually, there is enough aldosterone. Primary Adrenal Insufficiency can be caused by:  Autoimmune disease. Your body can produce antibodies that attack its own organs (in this case, your adrenal glands). The reasons why this happens are not well understood at this time. Sometimes, other organs are also affected (the polyglandular autoimmune syndromes).  An infection of the adrenal glands. Possible causes include tuberculosis, viruses (including HIV), and fungal infections. Secondary Adrenal Insufficiency This form is much more common than the primary form. It can be traced to a lack of ACTH. Without ACTH, the adrenal glands cannot make  cortisol. Causes include:  Diseases of the pituitary gland. This is a small gland in the brain that controls many important body functions. The pituitary gland produces ACTH, among other hormones. Pituitary gland tumors, injury, or surgery can cause inadequate ACTH production, which causes inadequate cortisol production.  Medications:  Megestrol (used for cancer treatment and to stimulate appetite) and some pain medications can impair production of cortisol.  Use of cortisol medication (steroids) causes your adrenal glands to not produce cortisol. When you stop this medication, it can take time for the adrenal glands to start producing cortisol again. This can be a dangerous situation, requiring slowly reducing your cortisol medicine. SYMPTOMS  Symptoms of adrenal insufficiency normally begin slowly. Problems seen with the disease are:  Severe tiredness (fatigue).  Muscle weakness.  Loss of appetite.  Weight loss. About 50% of the time, the following symptoms occur:   Nausea, vomiting and diarrhea.  Drops in blood pressure.  Dizziness or fainting.  Darkening of the skin (with primary disease only).  Being easily angered (irritable).  Depression.  Salt craving.  Low blood sugar (hypoglycemia).  Irregular or no menstrual periods. The symptoms slowly get worse. They are often ignored until a stressful event like an illness or an accident occurs. This is called an Addisonian crisis, or acute adrenal insufficiency. Without treatment, an Addisonian crisis can cause death. Symptoms of an Addisonian crisis include:  Sudden, severe pain in the lower back, abdomen, or legs.  Severe vomiting and diarrhea.  Dehydration.  Low blood pressure.  Loss of consciousness. DIAGNOSIS  In its early stages, adrenal insufficiency can be hard to diagnose. Your caregiver will need to:  Review your medical history.  Review your symptoms, such as dark tanning of the  skin.  Perform lab  tests. Results will show if levels of cortisol are too low, and will help identify the cause. CT scan or MRI scan of the adrenal and pituitary glands may also be necessary. TREATMENT  With proper treatment, you can live a normal life with Addison's disease or adrenal insufficiency.  Missing hormones need to be replaced in order to treat Addison's disease. Cortisol is replaced with hydrocortisone tablets taken by mouth. Other forms of cortisol may also be used. Since cortisone levels normally are higher in the morning and lower in the evening, you may need different doses at different times of the day. Be sure to follow your instructions carefully.  If your body cannot maintain the right levels of salt (sodium) and fluids because of too little aldosterone, you will also be given a medication. This drug replaces aldosterone. Important points about treatment:  Any sudden (acute) illness can increase your body's need for cortisol. Surgery, or other stress on the body, can do the same. If you have Addison's disease and you are ill or having surgery, you will need an increase in hormone medication to prevent an Addisonian crisis. Untreated, an Addisonian crisis can cause death.  If you are too ill to take your medication or you cannot keep it down, you must take medicine through a shot (injection). You or someone who lives with you will need to learn how to give you this injection. The shot will take the place of hydrocortisone. If you find it necessary to give yourself injectable medication, call your caregiver right away, or go to the nearest hospital emergency room. HOME CARE INSTRUCTIONS   Always carry an identification card stating your condition in case of an emergency. The card should:  Alert emergency personnel about the need to inject 100 mg of cortisol if you are severely hurt or cannot respond.  Include your caregiver's name and phone number.  Include the name and number of your closest  relative to contact.  When traveling, carry a needle, syringe, and an injectable form of cortisol for emergencies.  Know how to increase medication during periods of stress or mild colds.  Wear a warning bracelet or neck chain to alert emergency personnel. Many companies sell medical ID products.  Take your medications as prescribed. Do not stop your medications without medical supervision.  Learn about your condition. Ask your caregiver for further resources. This is a potentially dangerous, but easily managed illness. SEEK MEDICAL CARE IF:   You have Addison's disease and you are in need of surgery.  You have Addison's disease and you have an acute illness.  You have weakness, weight loss, or other unexplained symptoms. SEEK IMMEDIATE MEDICAL CARE IF:   You have symptoms of crisis:  Sudden, severe pain in the lower back, abdomen, or legs.  Severe vomiting and diarrhea.  Dehydration.  Low blood pressure.  Loss of consciousness.  You are experiencing severe:  Infections or other illness.  Vomiting.  Diarrhea. Document Released: 06/05/2005 Document Revised: 05/22/2012 Document Reviewed: 10/27/2008 [facility] Patient Information 2014 [place], [place]. Crohn's Disease Crohn's disease is a long-term (chronic) soreness and redness (inflammation) of the intestines (bowel). It can affect any portion of the digestive tract, from the mouth to the anus. It can also cause problems outside the digestive tract. Crohn's disease is closely related to a disease called ulcerative colitis (together, these two diseases are called inflammatory bowel disease).  CAUSES  The cause of Crohn's disease is not known. One Nelva Bush is that,  in an easily affected person, the immune system is triggered to attack the body's own digestive tissue. Crohn's disease runs in families. It seems to be more common in certain geographic areas and amongst certain races. There are no clear-cut dietary causes.  SYMPTOMS    Crohn's disease can cause many different symptoms since it can affect many different parts of the body. Symptoms include:  Fatigue.  Weight loss.  Chronic diarrhea, sometime bloody.  Abdominal pain and cramps.  Fever.  Ulcers or canker sores in the mouth or rectum.  Anemia (low red blood cells).  Arthritis, skin problems, and eye problems may occur. Complications of Crohn's disease can include:  Series of holes (perforation) of the bowel.  Portions of the intestines sticking to each other (adhesions).  Obstruction of the bowel.  Fistula formation, typically in the rectal area but also in other areas. A fistula is an opening between the bowels and the outside, or between the bowels and another organ.  A painful crack in the mucous membrane of the anus (rectal fissure). DIAGNOSIS  Your caregiver may suspect Crohn's disease based on your symptoms and an exam. Blood tests may confirm that there is a problem. You may be asked to submit a stool specimen for examination. X-rays and CT scans may be necessary. Ultimately, the diagnosis is usually made after a procedure that uses a flexible tube that is inserted via your mouth or your anus. This is done under sedation and is called either an upper endoscopy or colonoscopy. With these tests, the specialist can take tiny tissue samples and remove them from the inside of the bowel (biopsy). Examination of this biopsy tissue under a microscope can reveal Crohn's disease as the cause of your symptoms. Due to the many different forms that Crohn's disease can take, symptoms may be present for several years before a diagnosis is made. TREATMENT  Medications are often used to decrease inflammation and control the immune system. These include medicines related to aspirin, steroid medications, and newer and stronger medications to slow down the immune system. Some medications may be used as suppositories or enemas. A number of other medications are used  or have been studied. Your caregiver will make specific recommendations. HOME CARE INSTRUCTIONS   Symptoms such as diarrhea can be controlled with medications. Avoid foods that have a laxative effect such as fresh fruit, vegetables and dairy products. During flare ups, you can rest your bowel by refraining from solid foods. Drink clear liquids frequently during the day (electrolyte or re-hydrating fluids are best. Your caregiver can help you with suggestions). Drink often to prevent loss of body fluids (dehydration). When diarrhea has cleared, eat small meals and more frequently. Avoid food additives and stimulants such as caffeine (coffee, tea, or chocolate). Enzyme supplements may help if you develop intolerance to a sugar in dairy products (lactose). Ask your caregiver or dietitian about specific dietary instructions.  Try to maintain a positive attitude. Learn relaxation techniques such as self hypnosis, mental imaging, and muscle relaxation.  If possible, avoid stresses which can aggravate your condition.  Exercise regularly.  Follow your diet.  Always get plenty of rest. SEEK MEDICAL CARE IF:   Your symptoms fail to improve after a week or two of new treatment.  You experience continued weight loss.  You have ongoing cramps or loose bowels.  You develop a new skin rash, skin sores, or eye problems. SEEK IMMEDIATE MEDICAL CARE IF:   You have worsening of your symptoms or develop  new symptoms.  You have a fever.  You develop bloody diarrhea.  You develop severe abdominal pain. MAKE SURE YOU:   Understand these instructions.  Will watch your condition.  Will get help right away if you are not doing well or get worse. Document Released: 03/15/2005 Document Revised: 09/30/2012 Document Reviewed: 02/11/2007 [facility] Patient Information 2014 [place], [place].

## 2013-05-21 ENCOUNTER — Ambulatory Visit: Payer: Managed Care, Other (non HMO) | Admitting: Internal Medicine

## 2013-05-28 ENCOUNTER — Telehealth: Payer: Self-pay | Admitting: Radiology

## 2013-05-28 NOTE — Telephone Encounter (Signed)
MRI of the brain has been denied by [facility]

## 2013-06-03 ENCOUNTER — Encounter: Payer: Self-pay | Admitting: *Deleted

## 2013-06-03 ENCOUNTER — Other Ambulatory Visit: Payer: Self-pay | Admitting: Internal Medicine

## 2013-06-03 DIAGNOSIS — Z0271 Encounter for disability determination: Secondary | ICD-10-CM

## 2013-06-05 NOTE — Telephone Encounter (Signed)
Can we call pt and clarify.  Requesting RF of tramadol but pt should still have 10 days of Norco left.  What is the plan for his pain mgmt?  Unclear from Dr. Ernestene Mention note

## 2013-06-06 NOTE — Telephone Encounter (Signed)
Ready

## 2013-06-07 ENCOUNTER — Ambulatory Visit (INDEPENDENT_AMBULATORY_CARE_PROVIDER_SITE_OTHER): Payer: Managed Care, Other (non HMO) | Admitting: Internal Medicine

## 2013-06-07 VITALS — BP 86/60 | HR 112 | Temp 98.0°F | Resp 20 | Ht 68.5 in | Wt 139.6 lb

## 2013-06-07 DIAGNOSIS — IMO0001 Reserved for inherently not codable concepts without codable children: Secondary | ICD-10-CM

## 2013-06-07 DIAGNOSIS — K639 Disease of intestine, unspecified: Secondary | ICD-10-CM

## 2013-06-07 DIAGNOSIS — I10 Essential (primary) hypertension: Secondary | ICD-10-CM

## 2013-06-07 DIAGNOSIS — R634 Abnormal weight loss: Secondary | ICD-10-CM

## 2013-06-07 DIAGNOSIS — R05 Cough: Secondary | ICD-10-CM

## 2013-06-07 DIAGNOSIS — R319 Hematuria, unspecified: Secondary | ICD-10-CM

## 2013-06-07 DIAGNOSIS — G4700 Insomnia, unspecified: Secondary | ICD-10-CM

## 2013-06-07 DIAGNOSIS — R609 Edema, unspecified: Secondary | ICD-10-CM

## 2013-06-07 DIAGNOSIS — E860 Dehydration: Secondary | ICD-10-CM

## 2013-06-07 DIAGNOSIS — M1990 Unspecified osteoarthritis, unspecified site: Secondary | ICD-10-CM

## 2013-06-07 DIAGNOSIS — D649 Anemia, unspecified: Secondary | ICD-10-CM

## 2013-06-07 DIAGNOSIS — I809 Phlebitis and thrombophlebitis of unspecified site: Secondary | ICD-10-CM

## 2013-06-07 DIAGNOSIS — R1110 Vomiting, unspecified: Secondary | ICD-10-CM

## 2013-06-07 DIAGNOSIS — I639 Cerebral infarction, unspecified: Secondary | ICD-10-CM

## 2013-06-07 DIAGNOSIS — J449 Chronic obstructive pulmonary disease, unspecified: Secondary | ICD-10-CM

## 2013-06-07 DIAGNOSIS — S0180XA Unspecified open wound of other part of head, initial encounter: Secondary | ICD-10-CM

## 2013-06-07 DIAGNOSIS — I6350 Cerebral infarction due to unspecified occlusion or stenosis of unspecified cerebral artery: Secondary | ICD-10-CM

## 2013-06-07 DIAGNOSIS — R059 Cough, unspecified: Secondary | ICD-10-CM

## 2013-06-07 DIAGNOSIS — K5900 Constipation, unspecified: Secondary | ICD-10-CM

## 2013-06-07 DIAGNOSIS — E538 Deficiency of other specified B group vitamins: Secondary | ICD-10-CM

## 2013-06-07 DIAGNOSIS — I998 Other disorder of circulatory system: Secondary | ICD-10-CM

## 2013-06-07 DIAGNOSIS — Z7189 Other specified counseling: Secondary | ICD-10-CM

## 2013-06-07 DIAGNOSIS — R269 Unspecified abnormalities of gait and mobility: Secondary | ICD-10-CM

## 2013-06-07 DIAGNOSIS — R000 Tachycardia, unspecified: Secondary | ICD-10-CM

## 2013-06-07 DIAGNOSIS — R630 Anorexia: Secondary | ICD-10-CM

## 2013-06-07 DIAGNOSIS — N390 Urinary tract infection, site not specified: Secondary | ICD-10-CM

## 2013-06-07 DIAGNOSIS — I959 Hypotension, unspecified: Secondary | ICD-10-CM

## 2013-06-07 DIAGNOSIS — I2699 Other pulmonary embolism without acute cor pulmonale: Secondary | ICD-10-CM

## 2013-06-07 LAB — POCT CBC
Granulocyte percent: 64.9 %G (ref 37–80)
Hemoglobin: 10.6 g/dL — AB (ref 14.1–18.1)
MCH, POC: 26.5 pg — AB (ref 27–31.2)
MCV: 88.9 fL (ref 80–97)
MID (cbc): 0.7 (ref 0–0.9)
MPV: 7.3 fL (ref 0–99.8)
POC MID %: 8.9 %M (ref 0–12)
Platelet Count, POC: 251 10*3/uL (ref 142–424)
RBC: 4 M/uL — AB (ref 4.69–6.13)
WBC: 7.8 10*3/uL (ref 4.6–10.2)

## 2013-06-07 MED ORDER — METOPROLOL SUCCINATE ER 25 MG PO TB24: 25.0000 mg | ORAL_TABLET | Freq: Every day | ORAL | Status: AC

## 2013-06-07 MED ORDER — FUROSEMIDE 20 MG PO TABS: ORAL_TABLET | ORAL | Status: AC

## 2013-06-07 MED ORDER — PREDNISONE 5 MG PO TABS: 5.0000 mg | ORAL_TABLET | Freq: Every day | ORAL | Status: AC

## 2013-06-07 MED ORDER — HYDROCODONE-ACETAMINOPHEN 7.5-325 MG PO TABS: 1.0000 | ORAL_TABLET | Freq: Three times a day (TID) | ORAL | Status: DC | PRN

## 2013-06-07 NOTE — Progress Notes (Signed)
   Subjective:    Patient ID: Tyler [name], male    DOB: 1950/11/06, 62 y.o.   MRN: [id number]  HPI Tyler [name] to ER in [place] for chest pain, was admitted and had CT scan and stroke frontal lobe diagnosed, saw neuro there, also had EGD and had active gastritis. Put on pantoprazole. Xarelto was dced because of risk of falling. MRI I ordered was not done, patient canceled. Neuro referral I made last visit was not done. Gait is getting worse, B12 level was strong last visit. Crohns disease causes malabsorption.,Pulmonary embolus is untreated now due to risks of blood thinners, buergers disease is hurting and active as he continues to smoke. I am encourging him to get all his doctors near home: GI, neuro, cardio, pulmonary. He and wife agree.   Review of Systems     Objective:   Physical Exam  Constitutional: He is oriented to person, place, and time. No distress.  HENT:  Head: Normocephalic.  Eyes: EOM are normal. Pupils are equal, round, and reactive to light. No scleral icterus.  Neck: Normal range of motion. Neck supple. No thyromegaly present.  Cardiovascular: Regular rhythm and normal pulses.  Tachycardia present.   No murmur heard. Bilateral ankle edema  Pulmonary/Chest: Effort normal. He has decreased breath sounds. He has rhonchi.  Abdominal: There is no tenderness.  Musculoskeletal: He exhibits edema and tenderness.  Lymphadenopathy:    He has no cervical adenopathy.  Neurological: He is alert and oriented to person, place, and time. He displays atrophy and tremor. A sensory deficit is present. No cranial nerve deficit. He exhibits normal muscle tone. Coordination and gait abnormal.  BP 98/60  Skin: He is not diaphoretic.  Psychiatric: He has a normal mood and affect. His speech is normal and behavior is normal. Thought content normal. Cognition and memory are normal.  BP too low 86/66  Results for orders placed in visit on 06/07/13  POCT CBC      Result Value Range   WBC  7.8  4.6 - 10.2 K/uL   Lymph, poc 2.0  0.6 - 3.4   POC LYMPH PERCENT 26.2  10 - 50 %L   MID (cbc) 0.7  0 - 0.9   POC MID % 8.9  0 - 12 %M   POC Granulocyte 5.1  2 - 6.9   Granulocyte percent 64.9  37 - 80 %G   RBC 4.00 (*) 4.69 - 6.13 M/uL   Hemoglobin 10.6 (*) 14.1 - 18.1 g/dL   HCT, POC 40.9 (*) 81.1 - 53.7 %   MCV 88.9  80 - 97 fL   MCH, POC 26.5 (*) 27 - 31.2 pg   MCHC 29.8 (*) 31.8 - 35.4 g/dL   RDW, POC 91.4     Platelet Count, POC 251  142 - 424 K/uL   MPV 7.3  0 - 99.8 fL  GLUCOSE, POCT (MANUAL RESULT ENTRY)      Result Value Range   POC Glucose 99  70 - 99 mg/dl         Assessment & Plan:  Low blood pressure--decrease metoprolol 25mg /do home blood pressures and maintain over 110 systolic Lasix 20mg  qod Prednisone 5mg  qd for hand and foot pain/Buergers disease--his pleading request See specialists in [place] in follow up. Refer to neurology in [facility] with office in [place]

## 2013-06-07 NOTE — Progress Notes (Signed)
   Subjective:    Patient ID: Tyler [name], male    DOB: 1950/06/26, 62 y.o.   MRN: [id number]  HPI Patient presents after stroke (not sure when it happened) and staying in rehab facility in [place]   Review of Systems     Objective:   Physical Exam        Assessment & Plan:

## 2013-06-07 NOTE — Patient Instructions (Signed)
Stroke Prevention Some medical conditions and behaviors are associated with an increased chance of having a stroke. You may prevent a stroke by making healthy choices and managing medical conditions. Reduce your risk of having a stroke by:  Staying physically active. Get at least 30 minutes of activity on most or all days.  Not smoking. It may also be helpful to avoid exposure to secondhand smoke.  Limiting alcohol use. Moderate alcohol use is considered to be:  No more than 2 drinks per day for men.  No more than 1 drink per day for nonpregnant women.  Eating healthy foods.  Include 5 or more servings of fruits and vegetables a day.  Certain diets may be prescribed to address high blood pressure, high cholesterol, diabetes, or obesity.  Managing your cholesterol levels.  A low-saturated fat, low-trans fat, low-cholesterol, and high-fiber diet may control cholesterol levels.  Take any prescribed medicines to control cholesterol as directed by your caregiver.  Managing your diabetes.  A controlled-carbohydrate, controlled-sugar diet is recommended to manage diabetes.  Take any prescribed medicines to control diabetes as directed by your caregiver.  Controlling your high blood pressure (hypertension).  A low-salt (sodium), low-saturated fat, low-trans fat, and low-cholesterol diet is recommended to manage high blood pressure.  Take any prescribed medicines to control hypertension as directed by your caregiver.  Maintaining a healthy weight.  A reduced-calorie, low-sodium, low-saturated fat, low-trans fat, low-cholesterol diet is recommended to manage weight.  Stopping drug abuse.  Avoiding birth control pills.  Talk to your caregiver about the risks of taking birth control pills if you are over 35 years old, smoke, get migraines, or have ever had a blood clot.  Getting evaluated for sleep disorders (sleep apnea).  Talk to your caregiver about getting a sleep evaluation  if you snore a lot or have excessive sleepiness.  Taking medicines as directed by your caregiver.  For some people, aspirin or blood thinners (anticoagulants) are helpful in reducing the risk of forming abnormal blood clots that can lead to stroke. If you have the irregular heart rhythm of atrial fibrillation, you should be on a blood thinner unless there is a good reason you cannot take them.  Understand all your medicine instructions. SEEK IMMEDIATE MEDICAL CARE IF:   You have sudden weakness or numbness of the face, arm, or leg, especially on one side of the body.  You have sudden confusion.  You have trouble speaking (aphasia) or understanding.  You have sudden trouble seeing in one or both eyes.  You have sudden trouble walking.  You have dizziness.  You have a loss of balance or coordination.  You have a sudden, severe headache with no known cause.  You have new chest pain or an irregular heartbeat. Any of these symptoms may represent a serious problem that is an emergency. Do not wait to see if the symptoms will go away. Get medical help right away. Call your local emergency services (911 in [place] Do not drive yourself to the hospital. Document Released: 07/13/2004 Document Revised: 08/28/2011 Document Reviewed: 12/06/2012 [employer] Patient Information 2014 [employer].  

## 2013-06-09 ENCOUNTER — Encounter: Payer: Self-pay | Admitting: *Deleted

## 2013-06-17 ENCOUNTER — Telehealth: Payer: Self-pay

## 2013-06-17 NOTE — Telephone Encounter (Signed)
[facility] faxed form to be completed by Dr Perrin Maltese for pt's Plan of Care. I have put it in Dr Ernestene Mention box.

## 2013-06-24 ENCOUNTER — Telehealth: Payer: Self-pay

## 2013-06-24 ENCOUNTER — Other Ambulatory Visit: Payer: Self-pay | Admitting: Physician Assistant

## 2013-06-24 DIAGNOSIS — R630 Anorexia: Secondary | ICD-10-CM

## 2013-06-24 DIAGNOSIS — E860 Dehydration: Secondary | ICD-10-CM

## 2013-06-24 DIAGNOSIS — G4700 Insomnia, unspecified: Secondary | ICD-10-CM

## 2013-06-24 DIAGNOSIS — W19XXXA Unspecified fall, initial encounter: Secondary | ICD-10-CM

## 2013-06-24 DIAGNOSIS — K5900 Constipation, unspecified: Secondary | ICD-10-CM

## 2013-06-24 DIAGNOSIS — I998 Other disorder of circulatory system: Secondary | ICD-10-CM

## 2013-06-24 DIAGNOSIS — M1990 Unspecified osteoarthritis, unspecified site: Secondary | ICD-10-CM

## 2013-06-24 DIAGNOSIS — R000 Tachycardia, unspecified: Secondary | ICD-10-CM

## 2013-06-24 DIAGNOSIS — R634 Abnormal weight loss: Secondary | ICD-10-CM

## 2013-06-24 DIAGNOSIS — R1110 Vomiting, unspecified: Secondary | ICD-10-CM

## 2013-06-24 DIAGNOSIS — R059 Cough, unspecified: Secondary | ICD-10-CM

## 2013-06-24 DIAGNOSIS — J449 Chronic obstructive pulmonary disease, unspecified: Secondary | ICD-10-CM

## 2013-06-24 DIAGNOSIS — Z7189 Other specified counseling: Secondary | ICD-10-CM

## 2013-06-24 DIAGNOSIS — R197 Diarrhea, unspecified: Secondary | ICD-10-CM

## 2013-06-24 DIAGNOSIS — N390 Urinary tract infection, site not specified: Secondary | ICD-10-CM

## 2013-06-24 DIAGNOSIS — I959 Hypotension, unspecified: Secondary | ICD-10-CM

## 2013-06-24 DIAGNOSIS — E538 Deficiency of other specified B group vitamins: Secondary | ICD-10-CM

## 2013-06-24 DIAGNOSIS — R319 Hematuria, unspecified: Secondary | ICD-10-CM

## 2013-06-24 DIAGNOSIS — R05 Cough: Secondary | ICD-10-CM

## 2013-06-24 DIAGNOSIS — M0760 Enteropathic arthropathies, unspecified site: Secondary | ICD-10-CM

## 2013-06-24 DIAGNOSIS — I10 Essential (primary) hypertension: Secondary | ICD-10-CM

## 2013-06-24 DIAGNOSIS — K639 Disease of intestine, unspecified: Secondary | ICD-10-CM

## 2013-06-24 DIAGNOSIS — IMO0001 Reserved for inherently not codable concepts without codable children: Secondary | ICD-10-CM

## 2013-06-24 DIAGNOSIS — S0180XA Unspecified open wound of other part of head, initial encounter: Secondary | ICD-10-CM

## 2013-06-24 MED ORDER — HYDROCODONE-ACETAMINOPHEN 7.5-325 MG PO TABS: 1.0000 | ORAL_TABLET | Freq: Three times a day (TID) | ORAL | Status: AC | PRN

## 2013-06-24 NOTE — Telephone Encounter (Signed)
VM full. Left CB # to tell pt Rx is ready for p/up.

## 2013-06-24 NOTE — Telephone Encounter (Signed)
Faxed and mailed completed forms. Copied to be scanned.

## 2013-06-24 NOTE — Telephone Encounter (Signed)
Patient needs a refill on hydrocodone and prednisone  [phone]

## 2013-06-24 NOTE — Telephone Encounter (Signed)
Please advise on refill of hydrocodone, pended. Patient has refill on the prednisone already.

## 2013-06-25 NOTE — Telephone Encounter (Signed)
Pt just received refill of Hydrocodone and prednisone 06/24/13. Ok to refill this script also?

## 2013-07-01 IMAGING — CT CT HEAD W/O CM
2 series · 15 of 30 positions shown, 19 images · non-contrast
Comparison: 08/12/2007.

CLINICAL DATA: Stroke.  Cerebrovascular accident.

CT HEAD WITHOUT CONTRAST
TECHNIQUE: Contiguous axial images were obtained from the base of
the skull through the vertex without contrast.

[Series 2: head w/o · axial · non-contrast · 0.49mm/px · z∈[+246,+371]mm · 13 of 31 slices shown, 17 images]
[im 3/31  brain]
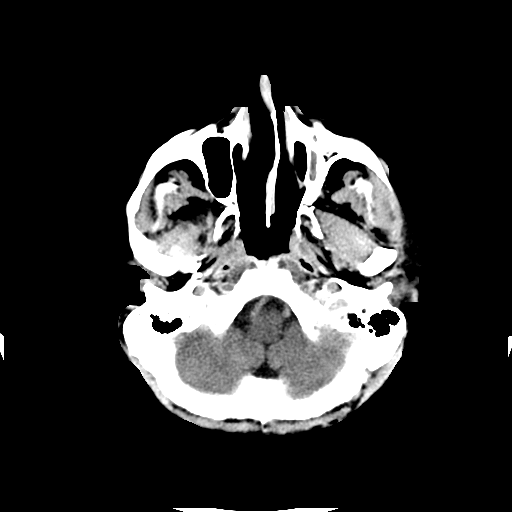
[im 3/31  bone]
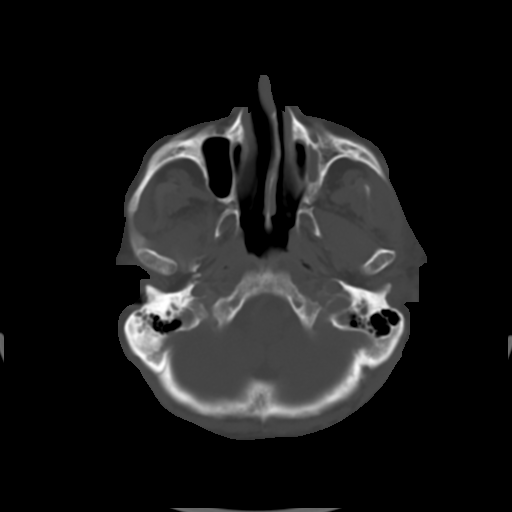
[im 5/31  brain]
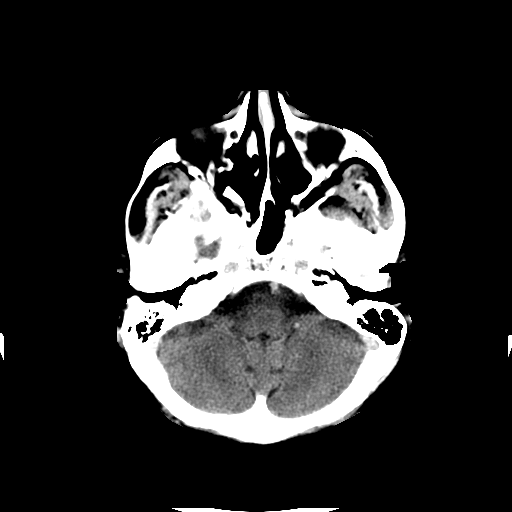
[im 7/31  brain]
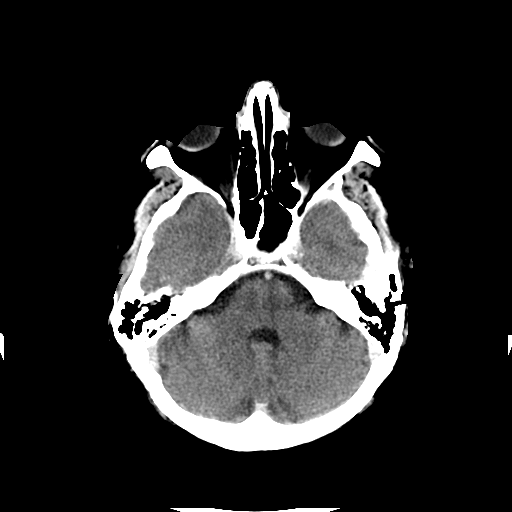
[im 9/31  brain]
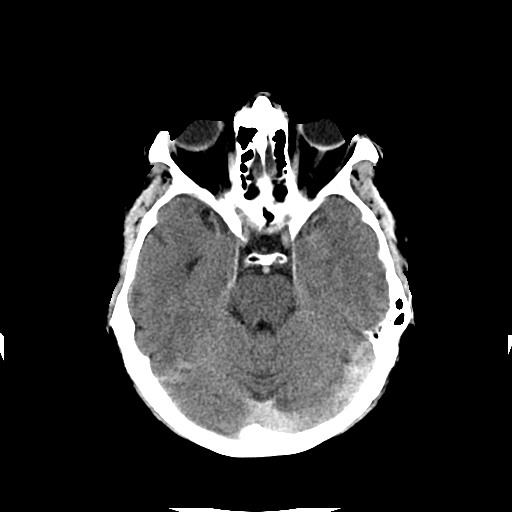
[im 11/31  brain]
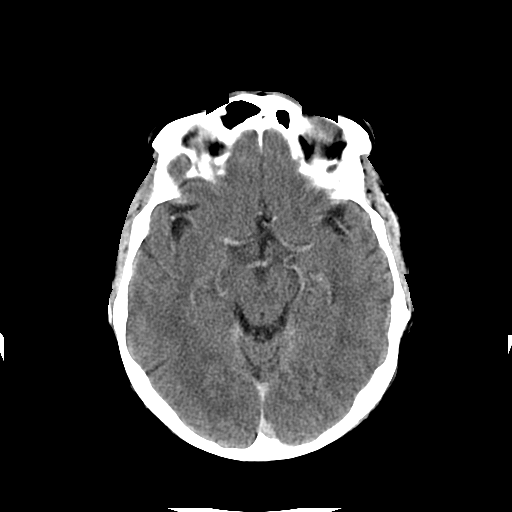
[im 11/31  bone]
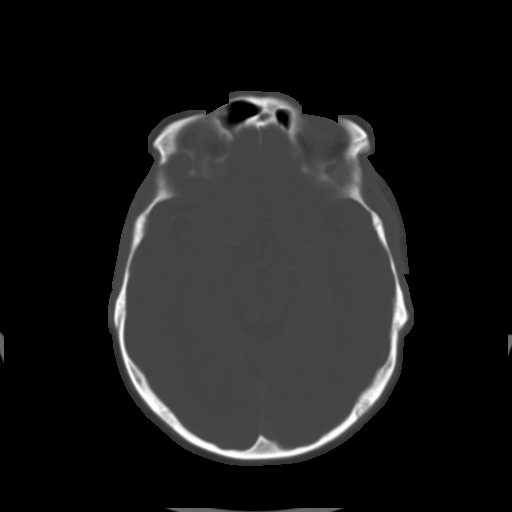
[im 13/31  brain]
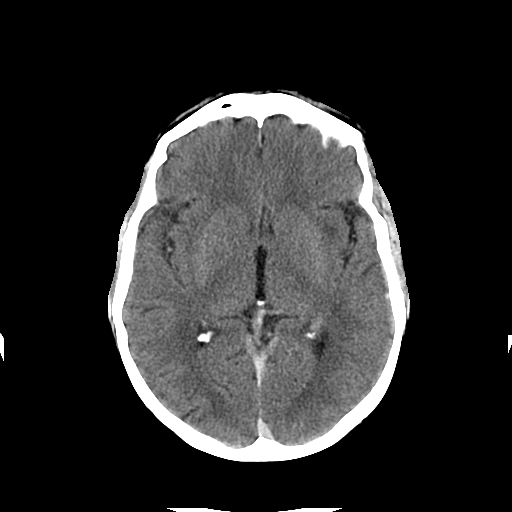
[im 16/31  brain]
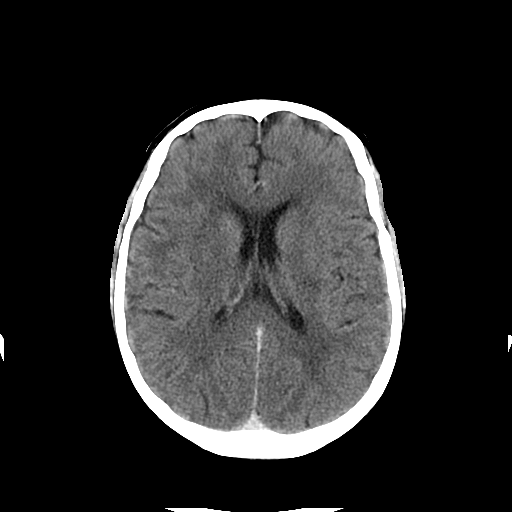
[im 18/31  brain]
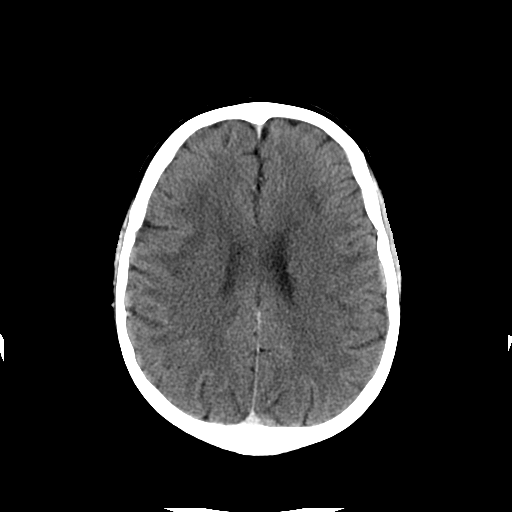
[im 20/31  brain]
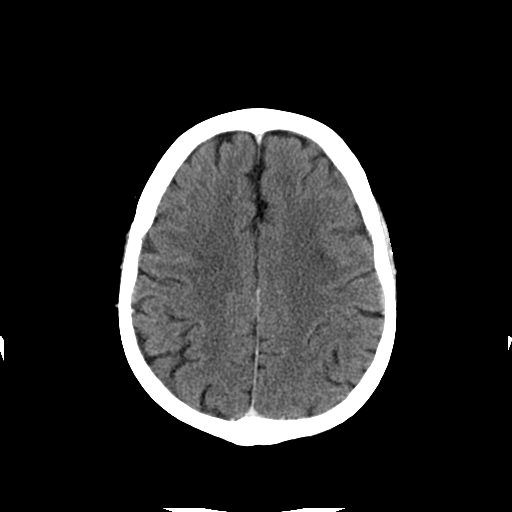
[im 20/31  bone]
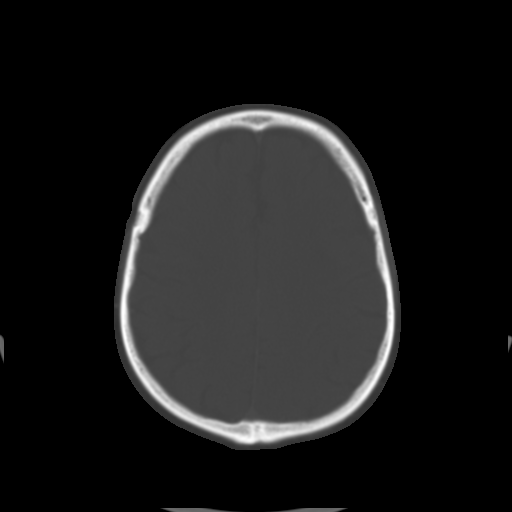
[im 22/31  brain]
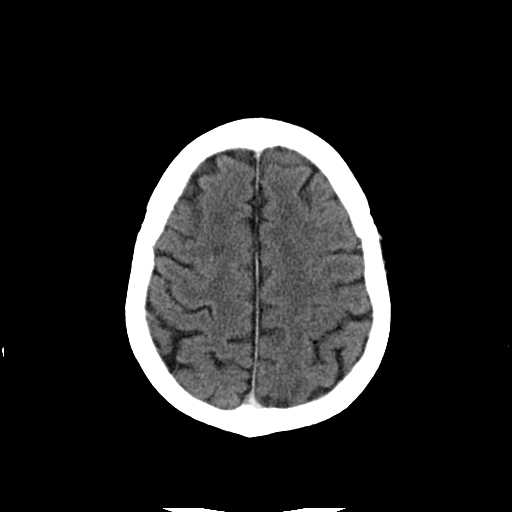
[im 24/31  brain]
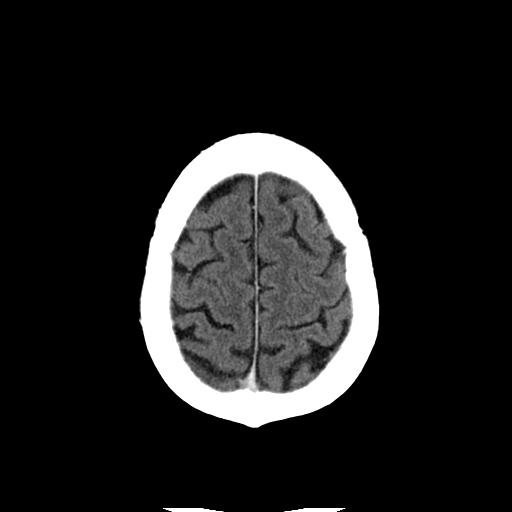
[im 26/31  brain]
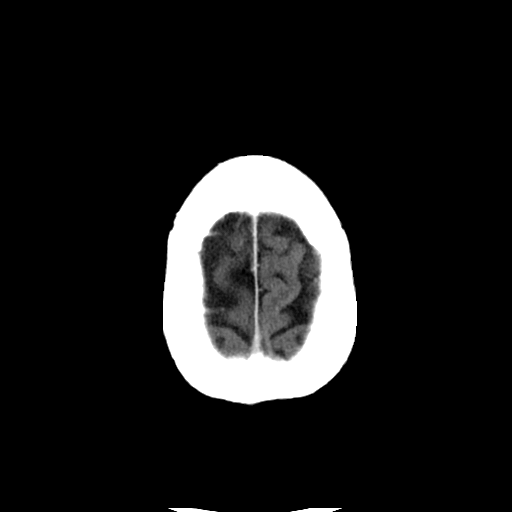
[im 28/31  brain]
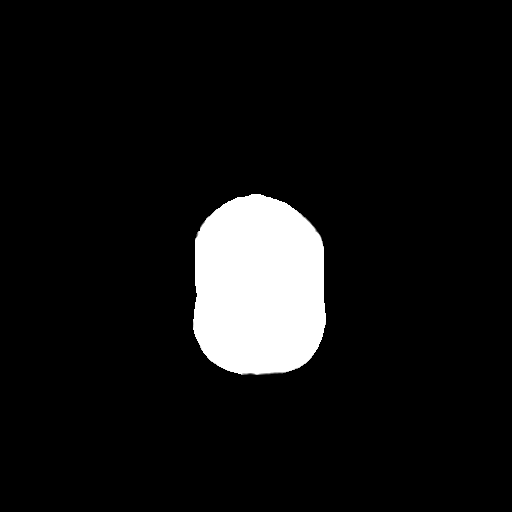
[im 28/31  bone]
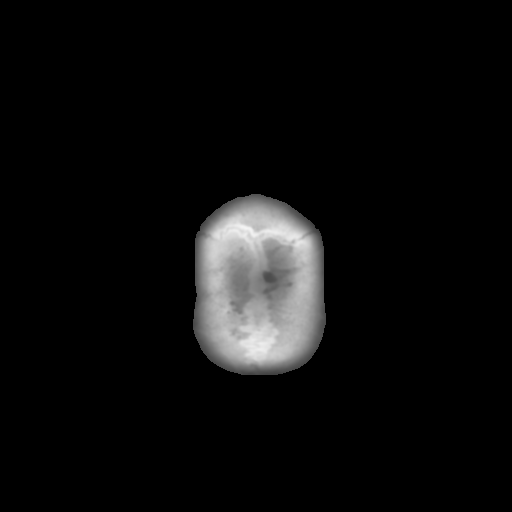

[Series 3: head w/o bone · axial · non-contrast · 0.49mm/px · z∈[+246,+266]mm · 2 of 31 slices shown]
[im 3/31  bone]
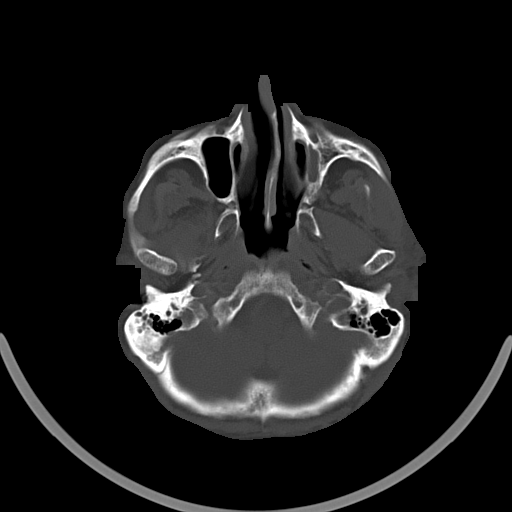
[im 7/31  bone]
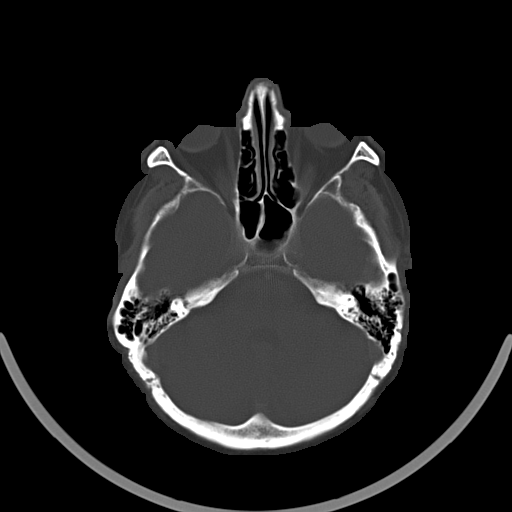

[15 of 30 positions shown; findings below may reference images not displayed]

FINDINGS: There is intravascular contrast, presumably associated
with catheterization.  This decreases the sensitivity for
hemorrhage.  There is chronic ischemic white matter disease that
appears similar to the prior examination.  Low attenuation in the
left sub insular region may represent a small lacunar infarct.
This is age indeterminant but did not appear present on the prior
exam of 08/12/2007.  Left middle cerebral artery appears opacified.
There is no mass lesion, midline shift, hydrocephalus or gross
intraparenchymal hemorrhage. No territorial infarct is identified.
Bone windows appear within normal limits.  Hypoplastic left
maxillary sinus with sclerosis suggest chronic paranasal sinus
disease.
IMPRESSION: 1.  Small area of low attenuation in the left sub insular region is
consistent with an age indeterminate lacunar infarct.
2.  Although this study is performed without IV infusion, there is
contrast in the intravascular system likely associated with
catheterization.
3.  Chronic ischemic white matter disease appears little changed
compared to prior.

## 2013-07-04 NOTE — Telephone Encounter (Signed)
Ok to rf? 

## 2013-07-07 NOTE — Telephone Encounter (Signed)
Tramadol called in

## 2013-07-07 NOTE — Telephone Encounter (Signed)
Rx has been done. 

## 2013-08-04 ENCOUNTER — Other Ambulatory Visit: Payer: Self-pay | Admitting: Internal Medicine

## 2013-08-07 ENCOUNTER — Telehealth: Payer: Self-pay

## 2013-08-07 NOTE — Telephone Encounter (Signed)
Pt would like a refill on prednisone and hydrodone. Best# [phone]

## 2013-08-07 NOTE — Telephone Encounter (Signed)
RTC

## 2013-08-08 ENCOUNTER — Ambulatory Visit: Payer: Managed Care, Other (non HMO) | Admitting: Internal Medicine

## 2013-08-08 ENCOUNTER — Other Ambulatory Visit: Payer: Self-pay | Admitting: Internal Medicine

## 2013-08-08 ENCOUNTER — Ambulatory Visit: Payer: Managed Care, Other (non HMO)

## 2013-08-08 VITALS — BP 120/62 | HR 125 | Temp 98.2°F | Resp 16 | Ht 69.0 in | Wt 141.0 lb

## 2013-08-08 DIAGNOSIS — D510 Vitamin B12 deficiency anemia due to intrinsic factor deficiency: Secondary | ICD-10-CM

## 2013-08-08 DIAGNOSIS — M25519 Pain in unspecified shoulder: Secondary | ICD-10-CM

## 2013-08-08 DIAGNOSIS — S42209A Unspecified fracture of upper end of unspecified humerus, initial encounter for closed fracture: Secondary | ICD-10-CM

## 2013-08-08 DIAGNOSIS — E538 Deficiency of other specified B group vitamins: Secondary | ICD-10-CM

## 2013-08-08 DIAGNOSIS — I498 Other specified cardiac arrhythmias: Secondary | ICD-10-CM

## 2013-08-08 DIAGNOSIS — S42213A Unspecified displaced fracture of surgical neck of unspecified humerus, initial encounter for closed fracture: Secondary | ICD-10-CM

## 2013-08-08 DIAGNOSIS — J449 Chronic obstructive pulmonary disease, unspecified: Secondary | ICD-10-CM

## 2013-08-08 DIAGNOSIS — R000 Tachycardia, unspecified: Secondary | ICD-10-CM

## 2013-08-08 LAB — POCT CBC
Granulocyte percent: 67.2 %G (ref 37–80)
HEMATOCRIT: 34.8 % — AB (ref 43.5–53.7)
Hemoglobin: 10.7 g/dL — AB (ref 14.1–18.1)
Lymph, poc: 2.3 (ref 0.6–3.4)
MCH: 29.2 pg (ref 27–31.2)
MCHC: 30.7 g/dL — AB (ref 31.8–35.4)
MCV: 94.8 fL (ref 80–97)
MID (CBC): 0.8 (ref 0–0.9)
MPV: 7.2 fL (ref 0–99.8)
PLATELET COUNT, POC: 237 10*3/uL (ref 142–424)
POC Granulocyte: 6.3 (ref 2–6.9)
POC LYMPH PERCENT: 24.1 %L (ref 10–50)
POC MID %: 8.7 %M (ref 0–12)
RBC: 3.67 M/uL — AB (ref 4.69–6.13)
RDW, POC: 19.6 %
WBC: 9.4 10*3/uL (ref 4.6–10.2)

## 2013-08-08 LAB — VITAMIN B12: Vitamin B-12: 546 pg/mL (ref 211–911)

## 2013-08-08 MED ORDER — HYDROCODONE-ACETAMINOPHEN 7.5-325 MG PO TABS: 1.0000 | ORAL_TABLET | Freq: Three times a day (TID) | ORAL | Status: AC | PRN

## 2013-08-08 MED ORDER — CYANOCOBALAMIN 1000 MCG/ML IJ SOLN
1000.0000 ug | Freq: Once | INTRAMUSCULAR | Status: AC
  Administered 2013-08-08: 1000 ug via INTRAMUSCULAR

## 2013-08-08 NOTE — Telephone Encounter (Signed)
Spoke to patient, he is aware and will come in tomorrow to see Dr. Merla Richesoolittle.

## 2013-08-08 NOTE — Progress Notes (Signed)
   Subjective:    Patient ID: Tyler GoadJames A Diemer, male    DOB: 01/16/1951, 63 y.o.   MRN: [id number]  HPI Larey SeatFell at home fxed proximal left humerus, balance much improved on B12 replacement. In a lot of pain from fx 2 weeks ago, will fr his vicodin. He is in process of establishing primary care in [place] and starting with pain clinic near their. See list of chronic diseases, he appears much improved. Hi weight , balance and strength are all better.   Review of Systems     Objective:   Physical Exam  Vitals reviewed. Constitutional: He is oriented to person, place, and time. He appears well-developed and well-nourished. He appears distressed.  HENT:  Head: Normocephalic.  Eyes: EOM are normal. No scleral icterus.  Neck: Normal range of motion.  Cardiovascular: Regular rhythm, S1 normal, S2 normal and intact distal pulses.   No extrasystoles are present. Tachycardia present.   Pulmonary/Chest: Effort normal. Not tachypneic. He has decreased breath sounds. He has wheezes. He has rhonchi. He has no rales.  Musculoskeletal:       Left shoulder: He exhibits decreased range of motion, tenderness, bony tenderness, swelling, crepitus, pain, spasm and decreased strength.  Neurological: He is alert and oriented to person, place, and time. No cranial nerve deficit or sensory deficit. He exhibits normal muscle tone. Coordination and gait normal.  Skin: No rash noted.  Psychiatric: He has a normal mood and affect. His behavior is normal.    Results for orders placed in visit on 08/08/13  POCT CBC      Result Value Ref Range   WBC 9.4  4.6 - 10.2 K/uL   Lymph, poc 2.3  0.6 - 3.4   POC LYMPH PERCENT 24.1  10 - 50 %L   MID (cbc) 0.8  0 - 0.9   POC MID % 8.7  0 - 12 %M   POC Granulocyte 6.3  2 - 6.9   Granulocyte percent 67.2  37 - 80 %G   RBC 3.67 (*) 4.69 - 6.13 M/uL   Hemoglobin 10.7 (*) 14.1 - 18.1 g/dL   HCT, POC 40.934.8 (*) 81.143.5 - 53.7 %   MCV 94.8  80 - 97 fL   MCH, POC 29.2  27 - 31.2 pg   MCHC 30.7 (*) 31.8 - 35.4 g/dL   RDW, POC 91.419.6     Platelet Count, POC 237  142 - 424 K/uL   MPV 7.2  0 - 99.8 fL   [facility] reading (PRIMARY) by  Dr.guest fx humerus neck, new callus   B12 IM     Assessment & Plan:  Fractured humerus at neck--2 weeks post fall/Sling and vicodin Pernicious anemia--B12 1000mcg IM/Crohns disease with short bowel syndrome COPD Ataxia resolved with B12 replacement Many other problems

## 2013-08-08 NOTE — Patient Instructions (Signed)
Shoulder Fracture (Proximal Humerus or Glenoid) °A shoulder fracture is a broken upper arm bone or a broken socket bone. The humerus is the upper arm bone and the glenoid is the shoulder socket. Proximal means the humerus is broken near the shoulder. Most of the time the bones of a broken shoulder are in an acceptable position. Usually, the injury can be treated with a shoulder immobilizer or sling and swath bandage. These devices support the arm and prevent any shoulder movement. If the bones are not in a good position, then surgery is sometimes needed. Shoulder fractures usually initially cause swelling, pain, and discoloration around the upper arm. They heal in 8 to 12 weeks with proper treatment. °SYMPTOMS  °At the time of injury: °· Pain. °· Tenderness. °· Regular body contours are not normal. °Later symptoms may include: °· Swelling and bruising of the elbow and hand. °· Swelling and bruising of the arm or chest. °Other symptoms include: °· Pain when lifting or turning the arm. °· Paralysis below the fracture. °· Numbness or coldness below the fracture. °CAUSES  °· Indirect force from falling on an outstretched arm. °· A blow to the shoulder. °RISK INCREASES WITH: °· Not being in shape. °· Playing contact sports, such as football, soccer, hockey, or rugby. °· Sports where falling on an outstretched arm occurs, such as basketball, skateboarding, or volleyball. °· History of bone or joint disease. °· History of shoulder injury. °PREVENTION °· Warm up before activity. °· Stretch before activity. °· Stay in shape with your: °· Heart fitness. °· Flexibility. °· Shoulder Strength. °· Falling with the proper technique. °PROGNOSIS  °In adults, healing time is about 7 weeks. For children, healing time is about 5 weeks. Surgery may be needed. °RELATED COMPLICATIONS °· The bones do not heal together (nonunion). °· The bones do not align properly when they heal (malunion). °· Long-term problems with pain, stiffness,  swelling, or loss of motion. °· The injured arm heals shorter than the other. °· Nerves are injured in the arm. °· Arthritis in the shoulder. °· Normal bone growth is interrupted in children. °· Blood supply to the shoulder joint is diminished. °TREATMENT °If the bones are aligned, then initial treatment will be with ice and medicine to help with pain. The shoulder will be held in place with a sling (immobilization). The shoulder will be allowed to heal for up to 6 weeks. Injuries that may need surgery include: °· Severe fractures. °· Fractures that are not in appropriate alignment (displaced). °· Non-displaced fractures (not common). °Surgery helps the bones align correctly. The bones may be held in place with: °· Sutures. °· Wires. °· Rods. °· Plates. °· Screws. °· Pins. °If you have had surgery or not, you will likely be assisted by a physical therapist or athletic trainer to get the best results with your injured shoulder. This will likely include exercises to strengthen and stretch the injured and surrounding areas. °MEDICATION °· If pain medicine is needed, nonsteroidal anti-inflammatory medicines (such as aspirin or ibuprofen) or other minor pain relievers (such as acetaminophen) are often advised. °· Do not take pain medicine for 7 days before surgery. °· Stronger pain relievers may be prescribed. Use only as directed and take only as much as you need. °COLD THERAPY °Cold treatment (icing) relieves pain and reduces inflammation. Cold treatment should be applied for 10 to 15 minutes every 2 to 3 hours, and immediately after activity that aggravates your symptoms. Use ice packs or an ice massage. °SEEK IMMEDIATE   MEDICAL CARE IF: °· You have severe shoulder pain unrelieved by rest and taking pain medicine. °· You have pain, numbness, tingling, or weakness in the hand or wrist. °· You have shortness of breath, chest pain, severe weakness, or fainting. °· You have severe pain with motion of the fingers or  wrist. °· Blue, gray, or dark color appears in the fingernails on injured extremity. °Document Released: 06/05/2005 Document Revised: 08/28/2011 Document Reviewed: 09/17/2008 °[employer]® Patient Information ©2014 [employer]. ° °

## 2013-09-17 ENCOUNTER — Other Ambulatory Visit: Payer: Self-pay | Admitting: Internal Medicine

## 2013-12-03 IMAGING — CR DG CHEST 2V
2 series · 2 of 2 positions shown · non-contrast
Comparison: Two-view chest 09/04/2011.

CLINICAL DATA: Shortness of breath.

CHEST - 2 VIEW

[PA]
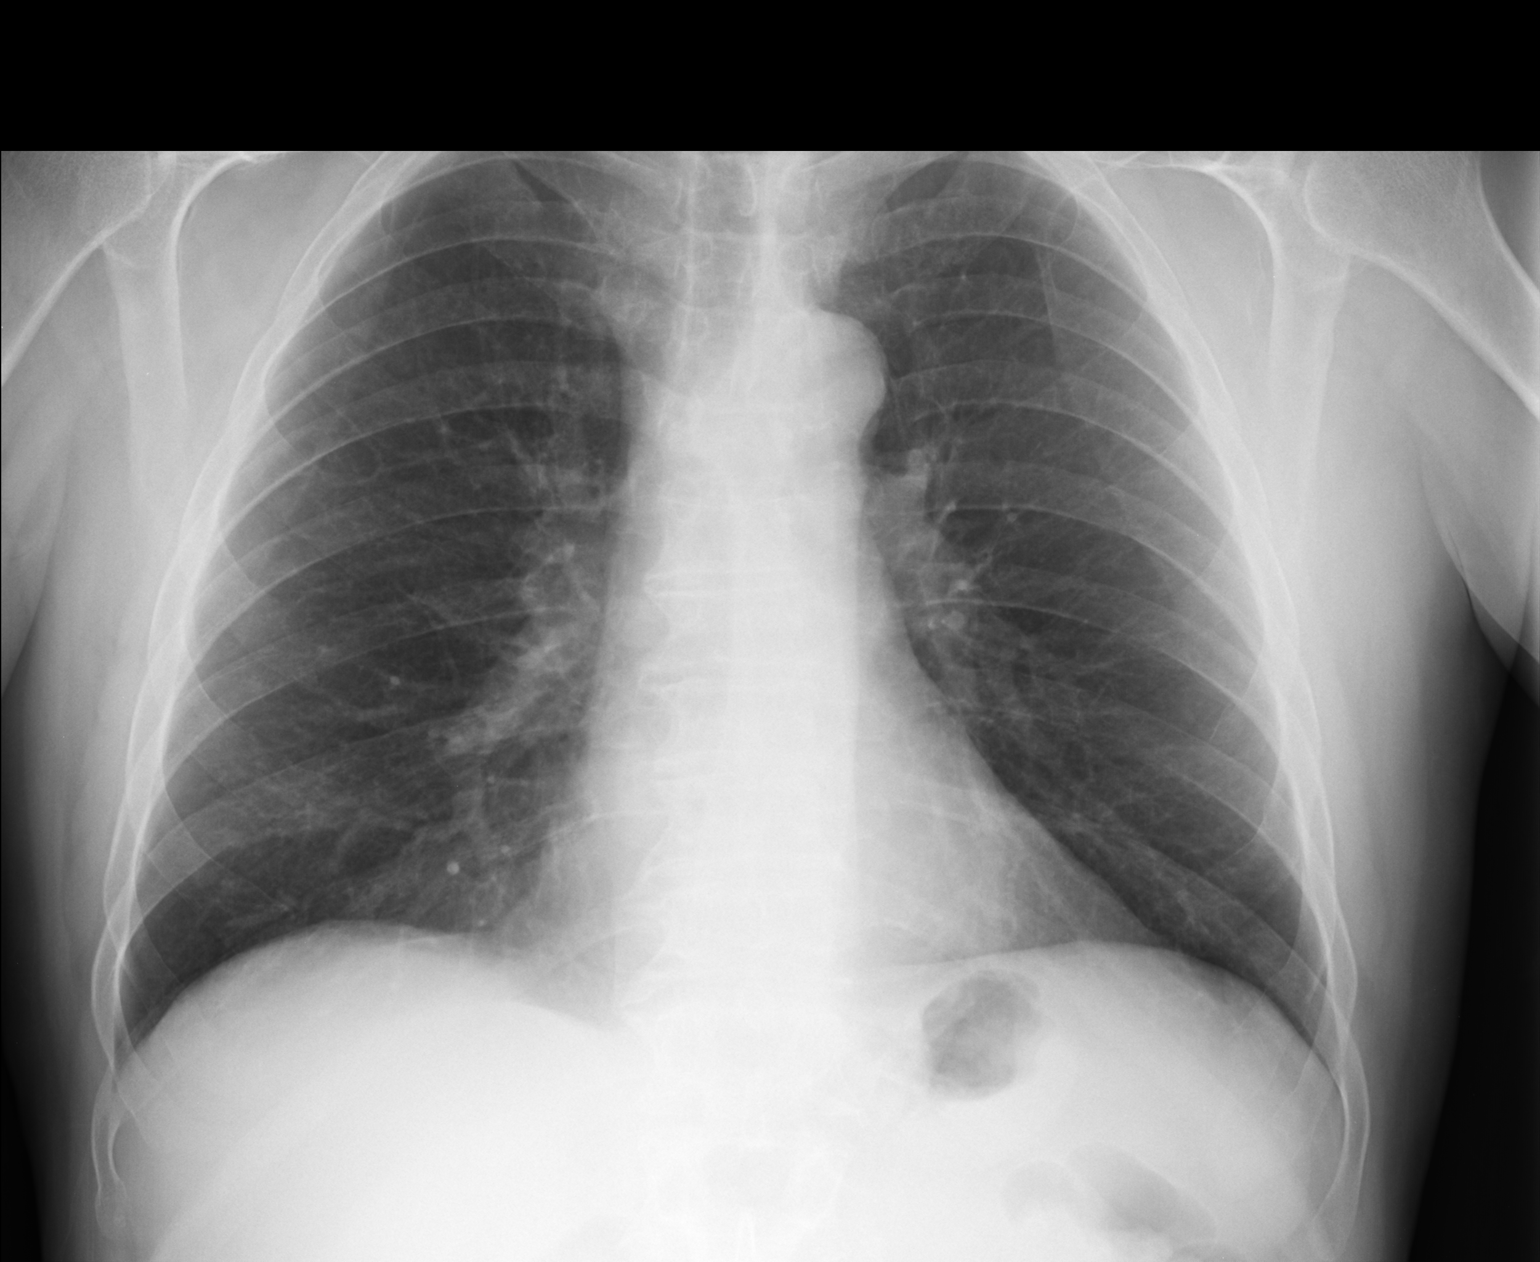

[lateral]
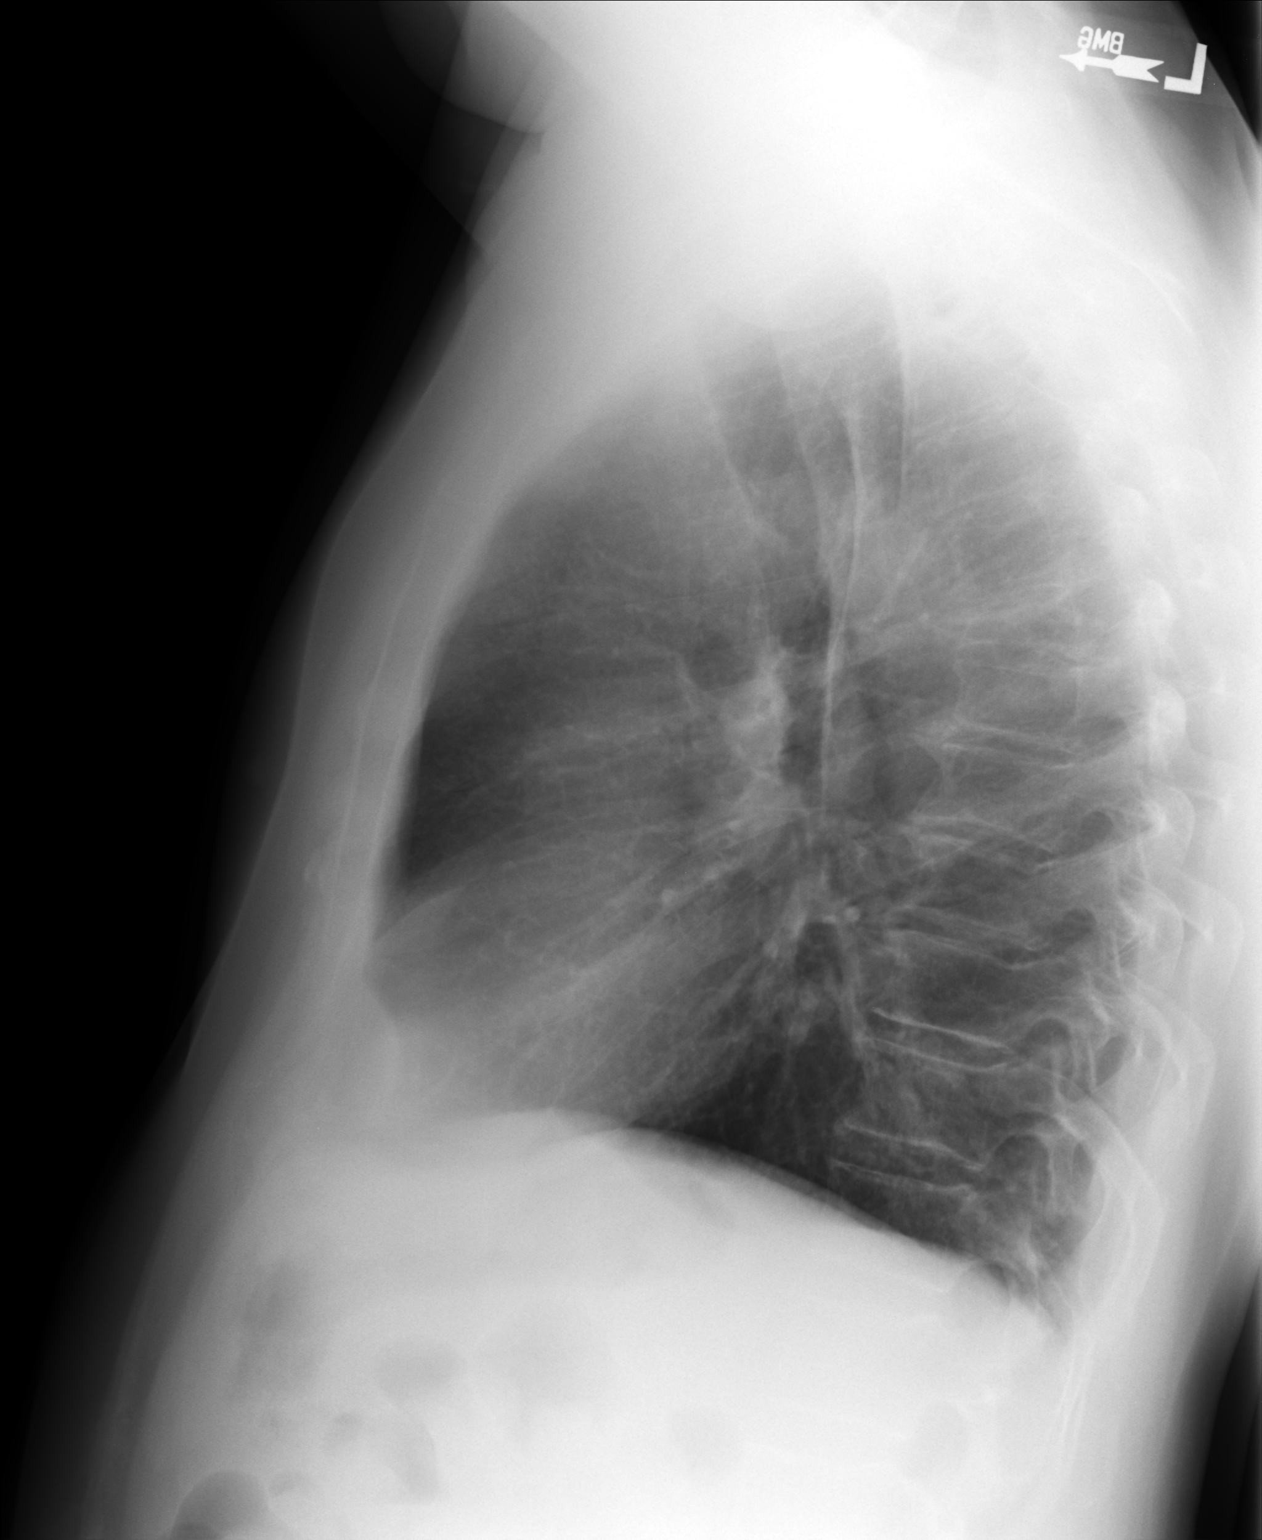

[2 of 2 positions shown; findings below may reference images not displayed]

FINDINGS: The heart size is normal.  The lungs are clear.  Mild
emphysematous changes are stable.  Degenerative changes are noted
in the thoracic spine.

An anterior wedge compression fracture at T6 is new since the prior
exam.
IMPRESSION: 1.  New T6 compression fracture.
2.  No acute cardiopulmonary disease.

Clinically significant discrepancy from primary report, if
provided: A new T6 compression fracture is present.

These results were called by telephone on 11/16/2011  at  [DATE]
a.m. to  Dr. Billiot, who verbally acknowledged these results.

## 2013-12-31 DIAGNOSIS — Z0271 Encounter for disability determination: Secondary | ICD-10-CM

## 2014-02-17 DEATH — deceased

## 2014-03-14 ENCOUNTER — Encounter: Payer: Self-pay | Admitting: Gastroenterology

## 2014-05-27 ENCOUNTER — Encounter (HOSPITAL_COMMUNITY): Payer: Self-pay | Admitting: Vascular Surgery

## 2014-11-03 ENCOUNTER — Telehealth: Payer: Self-pay

## 2014-11-03 NOTE — Telephone Encounter (Signed)
Pt's wife wants Dr. Perrin MalteseGuest and Dr. Merla Richesoolittle to know her husband has passed away this past October.

## 2014-11-03 NOTE — Telephone Encounter (Signed)
FYI Dr Perrin MalteseGuest and Merla Richesoolittle

## 2019-05-19 ENCOUNTER — Encounter: Payer: Self-pay | Admitting: Gastroenterology
